# Patient Record
Sex: Female | Born: 1975
Health system: Southern US, Community
[De-identification: ages and names within clinical notes are randomized; demographics above are authoritative.]

## PROBLEM LIST (undated history)

## (undated) DIAGNOSIS — J302 Other seasonal allergic rhinitis: Secondary | ICD-10-CM

## (undated) DIAGNOSIS — Z9889 Other specified postprocedural states: Secondary | ICD-10-CM

## (undated) HISTORY — DX: Other specified postprocedural states: Z98.890

## (undated) HISTORY — PX: FOOT SURGERY: SHX648

## (undated) HISTORY — PX: NECK SURGERY: SHX720

---

## 2000-04-28 ENCOUNTER — Other Ambulatory Visit: Admission: RE | Admit: 2000-04-28 | Discharge: 2000-04-28 | Payer: Self-pay | Admitting: Obstetrics and Gynecology

## 2002-05-04 ENCOUNTER — Emergency Department (HOSPITAL_COMMUNITY): Admission: EM | Admit: 2002-05-04 | Discharge: 2002-05-04 | Payer: Self-pay | Admitting: Internal Medicine

## 2002-05-06 ENCOUNTER — Emergency Department (HOSPITAL_COMMUNITY): Admission: EM | Admit: 2002-05-06 | Discharge: 2002-05-06 | Payer: Self-pay | Admitting: Emergency Medicine

## 2002-07-03 ENCOUNTER — Emergency Department (HOSPITAL_COMMUNITY): Admission: EM | Admit: 2002-07-03 | Discharge: 2002-07-04 | Payer: Self-pay | Admitting: Emergency Medicine

## 2002-07-03 ENCOUNTER — Emergency Department (HOSPITAL_COMMUNITY): Admission: EM | Admit: 2002-07-03 | Discharge: 2002-07-03 | Payer: Self-pay | Admitting: Emergency Medicine

## 2002-07-05 ENCOUNTER — Ambulatory Visit (HOSPITAL_COMMUNITY): Admission: RE | Admit: 2002-07-05 | Discharge: 2002-07-05 | Payer: Self-pay | Admitting: Family Medicine

## 2002-07-05 ENCOUNTER — Encounter: Payer: Self-pay | Admitting: Family Medicine

## 2004-01-03 ENCOUNTER — Ambulatory Visit (HOSPITAL_COMMUNITY): Admission: RE | Admit: 2004-01-03 | Discharge: 2004-01-04 | Payer: Self-pay | Admitting: Neurosurgery

## 2007-06-23 ENCOUNTER — Other Ambulatory Visit: Admission: RE | Admit: 2007-06-23 | Discharge: 2007-06-23 | Payer: Self-pay | Admitting: Obstetrics and Gynecology

## 2008-01-02 ENCOUNTER — Inpatient Hospital Stay (HOSPITAL_COMMUNITY): Admission: EM | Admit: 2008-01-02 | Discharge: 2008-01-04 | Payer: Self-pay | Admitting: Emergency Medicine

## 2008-01-02 ENCOUNTER — Ambulatory Visit: Payer: Self-pay | Admitting: Surgery

## 2008-01-09 ENCOUNTER — Encounter: Admission: RE | Admit: 2008-01-09 | Discharge: 2008-01-09 | Payer: Self-pay | Admitting: Surgery

## 2008-01-09 ENCOUNTER — Ambulatory Visit: Payer: Self-pay | Admitting: Surgery

## 2010-02-22 ENCOUNTER — Encounter: Payer: Self-pay | Admitting: Surgery

## 2010-06-16 NOTE — Discharge Summary (Signed)
NAMEGRISELL, [name]            ACCOUNT NO.:  [id number]   MEDICAL RECORD NO.:  [id number]          PATIENT TYPE:  INP   LOCATION:  2004                         FACILITY:  [facility]   PHYSICIAN:  Evelene Croon, M.D.     DATE OF BIRTH:  17-Apr-1975   DATE OF ADMISSION:  01/02/2008  DATE OF DISCHARGE:  01/04/2008                               DISCHARGE SUMMARY   HISTORY:  The patient is a 35 year old white female who underwent a  steroid injection in her left rhomboid muscle on the date of admission  at the Orthopedic office.  She said that she felt some pain during the  injection and then developed progressive chest pressure with shortness  of breath after the procedure.  A chest x-ray was obtained, and this  revealed a 40% left pneumothorax.  Dr. Althea Charon contacted Dr. Laneta Simmers who  instructed him to send the patient to the emergency department.   PAST MEDICAL HISTORY:  No significant medical illnesses.   PAST SURGICAL HISTORY:  Cervical spine fusion in 2005.   MEDICATIONS PRIOR TO ADMISSION:  None.   ALLERGIES:  None.   SOCIAL HISTORY:  She is a nonsmoker.  Denies alcohol use.  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   FAMILY HISTORY:  Unremarkable.   REVIEW OF SYMPTOMS AND PHYSICAL EXAMINATION:  Please see the history and  physical.   HOSPITAL COURSE:  The patient was seen in the emergency department by  Dr. Laneta Simmers.  The patient was felt to require prompt insertion of a left-  sided chest tube.  This was done by Dr. Laneta Simmers.  She tolerated the  procedure well and was admitted to the floor.  A followup chest x-ray  showed the tube in good position with the tip at the apex and the lung  re-expanded.  There was no visible air leak.   The patient was monitored closely.  She remained hemodynamically stable.  She had chest x-ray on January 03, 2008, which showed no pneumothorax.  The chest tube had no air leak.  It was placed on water-seal.  The  following day, another  chest x-ray was obtained.  Again, this revealed  only a tiny apical pneumothorax with no air leak.  The chest tube was  then discontinued, and a repeat chest x-ray revealed no change in the  very tiny left apical pneumothorax.  She was, otherwise, stable and  deemed to be acceptable for discharge on January 04, 2008.   MEDICATIONS ON DISCHARGE:  Include Vicodin 5/500 one every 6 hours as  needed for pain.  She is to continue her previous supplements p.r.n.,  which include glucosamine and multiple vitamins.   FOLLOWUP:  The patient will see Dr. Laneta Simmers in 1 week in the office with  sutures to be removed at that time.  A repeat chest x-ray will be then  performed at that time.   INSTRUCTIONS:  The patient will receive written instructions regarding  medications, activity, diet, wound care, and followup.   FINAL DIAGNOSIS:  Left-sided pneumothorax following injection of the  rhomboid muscle.   OTHER  DIAGNOSIS:  Previous cervical spine surgery.      Rowe Clack, P.A.-C.      Evelene Croon, M.D.  Electronically Signed    WEG/MEDQ  D:  01/04/2008  T:  01/04/2008  Job:  [id number]   cc:   Evelene Croon, M.D.  Dr. Althea Charon

## 2010-06-16 NOTE — Assessment & Plan Note (Signed)
OFFICE VISIT   [name], [name]  DOB:  1975/09/19                                        January 09, 2008  CHART #:  [id number]   The patient returned today for followup status post recent admission for  left chest tube placement for iatrogenic left pneumothorax.  This was  performed on 01/02/2008.  The chest tube was in for about 2 days and was  removed without difficulty.  Since discharge, she said she has been  feeling fairly well overall, but is still little sore.  She has had no  shortness of breath.   PHYSICAL EXAMINATION:  VITAL SIGNS:  Today, her blood pressure is  130/79, pulse 89 and regular, respiratory rate 18 and unlabored.  Oxygen  saturation on room air is 100%.  GENERAL:  She looks well.  LUNGS:  Clear.  The chest tube site is well healed and the suture was  removed.   Followup chest x-ray shows complete re-expansion of the left lung with  no pneumothorax or effusion.   IMPRESSION:  The patient is recovering well following last chest tube  placement for iatrogenic pneumothorax after a rhomboid muscle injection  at one of the local orthopedic offices.  She requires no further  treatment.  I told her she could return to work when she is comfortable  enough to do so.  There is no limitation to her activity other than  discomfort.  She does not need to return to see me unless she develops  recurrent chest pain or shortness of breath.   Evelene Croon, M.D.  Electronically Signed   BB/MEDQ  D:  01/09/2008  T:  01/09/2008  Job:  [id number]

## 2010-06-16 NOTE — Op Note (Signed)
[name], EDELMAN            ACCOUNT NO.:  [id number]   MEDICAL RECORD NO.:  [id number]          PATIENT TYPE:  INP   LOCATION:  2004                         FACILITY:  [facility]   PHYSICIAN:  Evelene Croon, M.D.     DATE OF BIRTH:  07/10/1975   DATE OF PROCEDURE:  01/02/2008  DATE OF DISCHARGE:                               OPERATIVE REPORT   PREOPERATIVE DIAGNOSIS:  Complete collapse of left lung secondary to  traumatic left pneumothorax.   POSTOPERATIVE DIAGNOSIS:  Complete collapse of left lung secondary to  traumatic left pneumothorax.   PROCEDURE:  Insertion of left chest tube.   SURGEON:  Evelene Croon, MD   ANESTHESIA:  Lidocaine 1% local with intravenous morphine sulfate 4 mg.   CLINICAL HISTORY:  This patient is a 35 year old white female who  underwent steroid injection in the left rhomboid muscle earlier today at  an orthopedic office.  She developed progressive shortness of breath and  chest pressure postprocedure and chest x-ray showed collapse of the left  lung.  She was sent to the emergency room where a followup chest x-ray  showed complete collapse of the left lung.  The best treatment was  insertion of a chest tube.  I discussed the procedure with the patient  including alternatives, benefits, and risks including bleeding, injury  to lung, persistent air leak, and inability to reexpand the lung.  She  understood and agreed to proceed.   OPERATIVE PROCEDURE:  The procedure was performed in the emergency room  on a stretcher.  She was given 4 mg of intravenous morphine sulfate for  pain control.  The left side of the chest was then prepped with Betadine  solution and draped in the usual sterile manner.  The skin and  subcutaneous tissue in the anterior axillary line just lateral to the  left breast was anesthetized with 1% lidocaine local anesthesia without  epinephrine.  I used about 12 mL of this.  Then, a small incision was  made and carried down through  the subcutaneous tissue bluntly.  The  pleural space was entered bluntly using hemostat.  There was a large  rush of air.  Then, a 20-French trocar chest tube was inserted without  difficulty.  The trocar was slightly withdrawn with tube advanced up to  the apex.  This trocar was removed with tube connected to suction.  This  tube was sutured to the skin with silk suture.  A dry sterile dressing  was applied around the tube, which was taped to the skin.  Followup  chest x-ray showed the tube in good position with the tip of the apex  and lung reexpanded.  There was no visible ongoing air leak.      Evelene Croon, M.D.  Electronically Signed     BB/MEDQ  D:  01/02/2008  T:  01/03/2008  Job:  [id number]

## 2010-06-16 NOTE — H&P (Signed)
[name], [name]            ACCOUNT NO.:  [id number]   MEDICAL RECORD NO.:  [id number]          PATIENT TYPE:  INP   LOCATION:  2004                         FACILITY:  [facility]   PHYSICIAN:  Evelene Croon, M.D.     DATE OF BIRTH:  1975/11/06   DATE OF ADMISSION:  01/02/2008  DATE OF DISCHARGE:                              HISTORY & PHYSICAL   REASON FOR ADMISSION:  Left pneumothorax.   CLINICAL HISTORY:  This patient is a 35 year old white female who  underwent a steroid injection into her left rhomboid muscle earlier  today at one of the orthopedic offices.  She said that she felt some  pain during the injection and then developed progressive chest pressure  and shortness of breath after the procedure.  A chest x-ray was obtained  which showed a 40% left pneumothorax by report.  I was called by Dr.  Althea Charon and I told him to send the patient to the emergency room.   PAST MEDICAL HISTORY:  She has no prior medical illnesses.  Her only  previous surgery is a cervical spine fusion in 2005.   MEDICATIONS:  None.   ALLERGIES:  None   SOCIAL HISTORY:  She is a nonsmoker.  She denies alcohol abuse.  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   FAMILY HISTORY:  Negative.   REVIEW OF SYSTEMS:  GENERAL:  She denies any fever or chills.  She has  had no recent weight changes.  She denies fatigue.  EYES:  Negative.  ENT: Negative.  ENDOCRINE:  She denies diabetes and hypothyroidism.  CARDIOVASCULAR:  She has no history of chest pain or heart disease.  She  has had no dyspnea prior to this pneumothorax.  She denies palpitations.  RESPIRATORY:  She denies cough and sputum production.  She has no  respiratory disease.  GI:  She has had no nausea, vomiting.  She denies  melena and bright red blood per rectum.  GU:  Negative.  HEMATOLOGIC:  Negative.  NEUROLOGIC:  She denies any history of TIA or stroke.  She  has had no focal weakness or numbness.  She denies dizziness and  syncope.  MUSCULOSKELETAL:  She has had cervical spine fusion and has  had a lot of pain in her left shoulder and upper back.  She has  undergone previous injection of her trapezius muscle on that side.   PHYSICAL EXAMINATION:  GENERAL:  She is a well-developed, muscular white  female who is obviously uncomfortable.  She is in no respiratory  distress.  VITAL SIGNS:  Her blood pressure is 110/50 and her pulse is 105 and  regular.  Respiratory rate is 18 and slightly labored.  HEENT:  Normocephalic and atraumatic.  Pupils are equal and reactive to  light and accommodation.  Extraocular muscles are intact.  The throat is  clear.  NECK:  Normal carotid pulses bilaterally.  There are no bruits.  There  is no adenopathy or thyromegaly.  There is no JVD.  CARDIAC:  Regular rate and rhythm with normal S1 and S2.  There is no  murmur, rub, or gallop.  LUNGS:  No breath sounds on the left, clear breath sounds on the right.  ABDOMEN:  Active bowel sounds.  Her abdomen is soft and nontender.  There are no palpable masses or organomegaly.  EXTREMITIES:  No peripheral edema.  Pedal pulses are palpable  bilaterally.  SKIN:  Warm and dry.  NEUROLOGIC:  Alert and oriented x3.  Motor and sensory exam is grossly  normal.   Chest x-ray here shows complete collapse of the left lung.   IMPRESSION:  Complete collapse of the left lung due to pneumothorax  after injection of the left rhomboid muscle.  We will plan to place a  left chest tube and admit the patient for chest tube management to  suction.      Evelene Croon, M.D.  Electronically Signed     BB/MEDQ  D:  01/02/2008  T:  01/03/2008  Job:  [id number]

## 2017-10-30 ENCOUNTER — Emergency Department (HOSPITAL_BASED_OUTPATIENT_CLINIC_OR_DEPARTMENT_OTHER): Payer: Commercial Managed Care - PPO

## 2017-10-30 ENCOUNTER — Encounter (HOSPITAL_BASED_OUTPATIENT_CLINIC_OR_DEPARTMENT_OTHER): Payer: Self-pay | Admitting: Emergency Medicine

## 2017-10-30 ENCOUNTER — Other Ambulatory Visit: Payer: Self-pay

## 2017-10-30 ENCOUNTER — Emergency Department (HOSPITAL_BASED_OUTPATIENT_CLINIC_OR_DEPARTMENT_OTHER)
Admission: EM | Admit: 2017-10-30 | Discharge: 2017-10-30 | Disposition: A | Discharge status: Home / Self Care | Payer: Commercial Managed Care - PPO | Attending: Emergency Medicine | Admitting: Emergency Medicine

## 2017-10-30 DIAGNOSIS — N760 Acute vaginitis: Secondary | ICD-10-CM | POA: Insufficient documentation

## 2017-10-30 DIAGNOSIS — D259 Leiomyoma of uterus, unspecified: Secondary | ICD-10-CM | POA: Insufficient documentation

## 2017-10-30 DIAGNOSIS — B9689 Other specified bacterial agents as the cause of diseases classified elsewhere: Secondary | ICD-10-CM

## 2017-10-30 DIAGNOSIS — R102 Pelvic and perineal pain: Secondary | ICD-10-CM

## 2017-10-30 DIAGNOSIS — R1031 Right lower quadrant pain: Secondary | ICD-10-CM | POA: Diagnosis not present

## 2017-10-30 DIAGNOSIS — N83201 Unspecified ovarian cyst, right side: Secondary | ICD-10-CM

## 2017-10-30 HISTORY — DX: Other seasonal allergic rhinitis: J30.2

## 2017-10-30 LAB — WET PREP, GENITAL
Sperm: NONE SEEN
Trich, Wet Prep: NONE SEEN
Yeast Wet Prep HPF POC: NONE SEEN

## 2017-10-30 LAB — CBC
HCT: 39.2 % (ref 36.0–46.0)
Hemoglobin: 12.8 g/dL (ref 12.0–15.0)
MCH: 28.3 pg (ref 26.0–34.0)
MCHC: 32.7 g/dL (ref 30.0–36.0)
MCV: 86.5 fL (ref 78.0–100.0)
Platelets: 264 10*3/uL (ref 150–400)
RBC: 4.53 MIL/uL (ref 3.87–5.11)
RDW: 14.2 % (ref 11.5–15.5)
WBC: 7 10*3/uL (ref 4.0–10.5)

## 2017-10-30 LAB — COMPREHENSIVE METABOLIC PANEL
ALT: 16 U/L (ref 0–44)
AST: 26 U/L (ref 15–41)
Albumin: 4.3 g/dL (ref 3.5–5.0)
Alkaline Phosphatase: 45 U/L (ref 38–126)
Anion gap: 10 (ref 5–15)
BUN: 15 mg/dL (ref 6–20)
CO2: 24 mmol/L (ref 22–32)
Calcium: 9 mg/dL (ref 8.9–10.3)
Chloride: 104 mmol/L (ref 98–111)
Creatinine, Ser: 0.73 mg/dL (ref 0.44–1.00)
GFR calc Af Amer: >60 mL/min (ref >60)
GFR calc non Af Amer: >60 mL/min (ref >60)
Glucose, Bld: 97 mg/dL (ref 70–99)
Potassium: 4 mmol/L (ref 3.5–5.1)
Sodium: 138 mmol/L (ref 135–145)
Total Bilirubin: 0.4 mg/dL (ref 0.3–1.2)
Total Protein: 7.4 g/dL (ref 6.5–8.1)

## 2017-10-30 LAB — URINALYSIS, ROUTINE W REFLEX MICROSCOPIC
Bilirubin Urine: NEGATIVE
Glucose, UA: NEGATIVE mg/dL
Hgb urine dipstick: NEGATIVE
Ketones, ur: NEGATIVE mg/dL
Leukocytes, UA: NEGATIVE
Nitrite: NEGATIVE
Protein, ur: NEGATIVE mg/dL
Specific Gravity, Urine: 1.015 (ref 1.005–1.030)
pH: 7 (ref 5.0–8.0)

## 2017-10-30 LAB — PREGNANCY, URINE: Preg Test, Ur: NEGATIVE

## 2017-10-30 LAB — LIPASE, BLOOD: Lipase: 26 U/L (ref 11–51)

## 2017-10-30 MED ORDER — OXYCODONE-ACETAMINOPHEN 5-325 MG PO TABS: 1.0000 | ORAL_TABLET | Freq: Three times a day (TID) | ORAL | 0 refills | Status: DC | PRN

## 2017-10-30 MED ORDER — KETOROLAC TROMETHAMINE 30 MG/ML IJ SOLN
30.0000 mg | Freq: Once | INTRAMUSCULAR | Status: AC
  Administered 2017-10-30: 30 mg via INTRAVENOUS
  Filled 2017-10-30: qty 1

## 2017-10-30 MED ORDER — SODIUM CHLORIDE 0.9 % IV BOLUS
1000.0000 mL | Freq: Once | INTRAVENOUS | Status: AC
  Administered 2017-10-30: 1000 mL via INTRAVENOUS

## 2017-10-30 MED ORDER — NAPROXEN 500 MG PO TABS: 500.0000 mg | ORAL_TABLET | Freq: Two times a day (BID) | ORAL | 0 refills | Status: DC

## 2017-10-30 MED ORDER — METRONIDAZOLE 500 MG PO TABS: 500.0000 mg | ORAL_TABLET | Freq: Two times a day (BID) | ORAL | 0 refills | Status: DC

## 2017-10-30 MED ORDER — OXYCODONE-ACETAMINOPHEN 5-325 MG PO TABS
1.0000 | ORAL_TABLET | Freq: Once | ORAL | Status: AC
  Administered 2017-10-30: 1 via ORAL
  Filled 2017-10-30: qty 1

## 2017-10-30 MED ORDER — IOPAMIDOL (ISOVUE-300) INJECTION 61%
100.0000 mL | Freq: Once | INTRAVENOUS | Status: AC | PRN
  Administered 2017-10-30: 100 mL via INTRAVENOUS

## 2017-10-30 NOTE — Discharge Instructions (Addendum)
Follow-up with your OB/GYN. Return to ED for worsening symptoms, severe abdominal pain or chest pain, abnormal vaginal bleeding, lightheadedness or chest pain.

## 2017-10-30 NOTE — ED Notes (Signed)
Patient transported to Ultrasound and returned at this time

## 2017-10-30 NOTE — ED Notes (Signed)
Pt ambulatory to d/c window with steady gait. She states her ride is coming back to pick her up

## 2017-10-30 NOTE — ED Triage Notes (Signed)
Patient states that she has had right lower abdominal pain since yesterday - Reports Nausea

## 2017-10-30 NOTE — ED Provider Notes (Signed)
[facility] Provider Note   CSN: [id number] Arrival date & time: 10/30/17  1115     History   Chief Complaint Chief Complaint  Patient presents with  . Abdominal Pain    HPI [name] is a 42 y.o. female who presents to ED for 24-hour history of right lower quadrant abdominal pain, nausea.  Denies any changes to bowel movements.  Patient is concerned that it may be due to her appendix.  She describes the pain as sharp and will sometimes radiate to her back.  Has not taken any medicine help with her symptoms.  Denies any vomiting, vaginal complaints, abnormal vaginal bleeding, fever, sick contacts.  She denies any prior abdominal surgeries.  Denies any alcohol, tobacco or other drug use or chronic NSAID use.  Denies possibility of pregnancy.  HPI  Past Medical History:  Diagnosis Date  . Seasonal allergies     There are no active problems to display for this patient.   Past Surgical History:  Procedure Laterality Date  . FOOT SURGERY Bilateral   . NECK SURGERY       OB History   None      Home Medications    Prior to Admission medications   Medication Sig Start Date End Date Taking? Authorizing Provider  metroNIDAZOLE (FLAGYL) 500 MG tablet Take 1 tablet (500 mg total) by mouth 2 (two) times daily. 10/30/17   Kendalynn Wideman, PA-C  naproxen (NAPROSYN) 500 MG tablet Take 1 tablet (500 mg total) by mouth 2 (two) times daily. 10/30/17   Timya Trimmer, PA-C  oxyCODONE-acetaminophen (PERCOCET/ROXICET) 5-325 MG tablet Take 1 tablet by mouth every 8 (eight) hours as needed for severe pain. 10/30/17   Delia Heady, PA-C    Family History History reviewed. No pertinent family history.  Social History Social History   Tobacco Use  . Smoking status: Never Smoker  . Smokeless tobacco: Never Used  Substance Use Topics  . Alcohol use: Never    Frequency: Never  . Drug use: Never     Allergies   Patient has no known allergies.   Review  of Systems Review of Systems  Constitutional: Negative for appetite change, chills and fever.  HENT: Negative for ear pain, rhinorrhea, sneezing and sore throat.   Eyes: Negative for photophobia and visual disturbance.  Respiratory: Negative for cough, chest tightness, shortness of breath and wheezing.   Cardiovascular: Negative for chest pain and palpitations.  Gastrointestinal: Positive for abdominal pain and nausea. Negative for blood in stool, constipation, diarrhea and vomiting.  Genitourinary: Negative for dysuria, hematuria and urgency.  Musculoskeletal: Negative for myalgias.  Skin: Negative for rash.  Neurological: Negative for dizziness, weakness and light-headedness.     Physical Exam Updated Vital Signs BP 115/71 (BP Location: Right Arm)   Pulse (!) 55   Temp 98.3 F (36.8 C) (Oral)   Resp 18   Ht 5\' 7"  (1.702 m)   Wt 69.4 kg   LMP 10/02/2017   SpO2 100%   BMI 23.96 kg/m   Physical Exam  Constitutional: She appears well-developed and well-nourished. No distress.  HENT:  Head: Normocephalic and atraumatic.  Nose: Nose normal.  Eyes: Conjunctivae and EOM are normal. Left eye exhibits no discharge. No scleral icterus.  Neck: Normal range of motion. Neck supple.  Cardiovascular: Normal rate, regular rhythm, normal heart sounds and intact distal pulses. Exam reveals no gallop and no friction rub.  No murmur heard. Pulmonary/Chest: Effort normal and breath sounds normal. No  respiratory distress.  Abdominal: Soft. Bowel sounds are normal. She exhibits no distension. There is tenderness in the right lower quadrant. There is no rebound and no guarding.  Genitourinary: Cervix exhibits no motion tenderness. Right adnexum displays no tenderness. Left adnexum displays no tenderness. Vaginal discharge found.  Genitourinary Comments: Pelvic exam: normal external genitalia without evidence of trauma. VULVA: normal appearing vulva with no masses, tenderness or lesion. VAGINA:  normal appearing vagina with normal color and discharge, no lesions. CERVIX: normal appearing cervix without lesions, cervical motion tenderness absent, cervical os closed with out purulent discharge; No vaginal discharge. Wet prep and DNA probe for chlamydia and GC obtained.   ADNEXA: normal adnexa in size, nontender and no masses UTERUS: uterus is normal size, shape, consistency and nontender.   Tech served as Oncologist.  Musculoskeletal: Normal range of motion. She exhibits no edema.  Neurological: She is alert. She exhibits normal muscle tone. Coordination normal.  Skin: Skin is warm and dry. No rash noted.  Psychiatric: She has a normal mood and affect.  Nursing note and vitals reviewed.    ED Treatments / Results  Labs (all labs ordered are listed, but only abnormal results are displayed) Labs Reviewed  WET PREP, GENITAL - Abnormal; Notable for the following components:      Result Value   Clue Cells Wet Prep HPF POC PRESENT (*)    WBC, Wet Prep HPF POC MANY (*)    All other components within normal limits  LIPASE, BLOOD  COMPREHENSIVE METABOLIC PANEL  CBC  URINALYSIS, ROUTINE W REFLEX MICROSCOPIC  PREGNANCY, URINE  GC/CHLAMYDIA PROBE AMP ([place]) NOT AT [facility]    EKG None  Radiology Ct Abdomen Pelvis W Contrast  Result Date: 10/30/2017 CLINICAL DATA:  Right lower quadrant pain with nausea and vomiting EXAM: CT ABDOMEN AND PELVIS WITH CONTRAST TECHNIQUE: Multidetector CT imaging of the abdomen and pelvis was performed using the standard protocol following bolus administration of intravenous contrast. CONTRAST:  100 mL ISOVUE-300 IOPAMIDOL (ISOVUE-300) INJECTION 61% COMPARISON:  None. FINDINGS: Lower chest: Lung bases are clear. Note that there is a degree of pectus excavatum. Hepatobiliary: No focal liver lesions are appreciable. Gallbladder wall is not appreciably thickened. There is no biliary duct dilatation. Pancreas: There is no evident pancreatic  mass or inflammatory focus. Spleen: No splenic lesions are evident. Adrenals/Urinary Tract: Adrenals bilaterally appear normal. Kidneys bilaterally show no evident mass or hydronephrosis on either side. There is no evident renal or ureteral calculus on either side. Urinary bladder is midline with wall thickness within normal limits. Stomach/Bowel: There is no appreciable bowel wall or mesenteric thickening. No bowel obstruction appreciable. No free air or portal venous air. Vascular/Lymphatic: No abdominal aortic aneurysm. No vascular lesions are evident. There is no adenopathy in the abdomen or pelvis. Reproductive: Uterus is retroverted. There is a mass in the mid to lower uterus measuring 5.0 x 5.0 cm, a presumed dominant leiomyoma. There is a thin walled low-attenuation mass posterior to the uterus in the right pelvis between the uterus and rectum measuring 4.7 x 4.3 cm. This mass displaces the rectosigmoid junction toward the left. There is surrounding moderate free fluid. No other pelvic mass evident. Other: Appendix appears unremarkable. There is no abscess in the abdomen or pelvis beyond potential tubo-ovarian abscess in the right posterior pelvis. No ascites seen outside of the cul-de-sac region. Musculoskeletal: There are pars defects at L5 bilaterally with minimal anterolisthesis of L5 on S1. There are no blastic or lytic bone  lesions. There is no intramuscular or abdominal wall lesions. IMPRESSION: 1. Thin walled mass with attenuation values slightly higher than is expected with a simple cyst between the uterus and rectosigmoid junction on the right with adjacent moderate free fluid in the cul-de-sac. This mass measures 4.7 x 4.3 cm. Differential considerations for this mass include hemorrhagic ovarian cyst, tubo-ovarian abscess, and endometrioma. 2. Retroverted uterus. 5 x 5 cm leiomyoma in the mid to lower uterine region. 3. Appendix appears normal. No evident bowel obstruction. No findings suggesting  abscess outside of the pelvic region. 4.  No evident renal or ureteral calculus.  No hydronephrosis. 5. Pars defects at L5 bilaterally with minimal spondylolisthesis at L5-S1. 6.  Pectus excavatum, incompletely visualized. Electronically Signed   By: Lowella Grip III M.D.   On: 10/30/2017 13:34   US Pelvic Complete With Transvaginal  Result Date: 10/30/2017 CLINICAL DATA:  Pelvic mass seen on CT imaging.  Pain. EXAM: TRANSABDOMINAL AND TRANSVAGINAL ULTRASOUND OF PELVIS TECHNIQUE: Both transabdominal and transvaginal ultrasound examinations of the pelvis were performed. Transabdominal technique was performed for global imaging of the pelvis including uterus, ovaries, adnexal regions, and pelvic cul-de-sac. It was necessary to proceed with endovaginal exam following the transabdominal exam to visualize the endometrium and ovaries. COMPARISON:  CT scan October 30, 2017 FINDINGS: Uterus Measurements: 8.7 x 5.3 x 7.8 cm. Contains a large fibroid in the right uterine body posteriorly measuring 5.6 x 4.7 x 5.4 cm. Endometrium Thickness: 17 mm.  No focal abnormality visualized. Right ovary Measurements: 6.8 x 4.6 x 4.7 cm. There is a complex mass with no internal blood flow in the right ovary measuring 4 x 4.1 x 3.9 cm, likely accounting for the recent CT finding. Left ovary Measurements: 3.7 x 1.7 x 2.9 cm. Normal appearance/no adnexal mass. Other findings A small amount of fluid in the cul-de-sac is likely physiologic. IMPRESSION: 1. There is a complex 4.1 x 4 x 3.9 cm mass in the right ovary without internal blood flow. This mass likely accounts for the CT findings. The mass is nonspecific but could represent a hemorrhagic cyst. A neoplasm is not excluded on this single study. Recommend a follow-up ultrasound in 6-12 weeks to ensure resolution. 2. Large fibroid in the uterus. 3. No other acute abnormalities. Electronically Signed   By: Dorise Bullion III M.D   On: 10/30/2017 16:23    Procedures Procedures  (including critical care time)  Medications Ordered in ED Medications  sodium chloride 0.9 % bolus 1,000 mL (0 mLs Intravenous Stopped 10/30/17 1353)  iopamidol (ISOVUE-300) 61 % injection 100 mL (100 mLs Intravenous Contrast Given 10/30/17 1237)  ketorolac (TORADOL) 30 MG/ML injection 30 mg (30 mg Intravenous Given 10/30/17 1436)  oxyCODONE-acetaminophen (PERCOCET/ROXICET) 5-325 MG per tablet 1 tablet (1 tablet Oral Given 10/30/17 1636)     Initial Impression / Assessment and Plan / ED Course  I have reviewed the triage vital signs and the nursing notes.  Pertinent labs & imaging results that were available during my care of the patient were reviewed by me and considered in my medical decision making (see chart for details).     42 year old female with no significant past medical history presents to ED for 24-hour history of right lower quadrant abdominal pain and nausea.  No changes to bowel movements.  No history of similar symptoms in the past.  No sick contacts with similar symptoms.  On exam there is tenderness palpation of the right lower no prior abdominal surgeries or possibility of  pregnancy.  Lab work significant for normal CBC, lipase, CMP, urinalysis and negative pregnancy test.  CT of abdomen pelvis was done to rule out appendicitis.  This was negative for appendicitis but did show 4 x 4 centimeter mass and fibroid in the uterus.  Pelvic exam revealed scant white vaginal discharge.  Blood prep shows WBC and clue cells.  Ultrasound of the pelvis shows redemonstrated 4 x 4 centimeter mass which is most likely a hemorrhagic cyst but could be neoplasm.  Also shows a large uterine fibroid.  Recommend repeat ultrasound in 6 to 12 days.  Patient states that she is scheduled to meet with a new OB/GYN in 3 weeks.  Encouraged her to keep this appointment for repeat testing in the appropriate amount of time.  In the meantime we will treat with Flagyl, short course of opiate pain medication and  anti-inflammatories.  Patient remains hemodynamically stable with pain well controlled here.  Will advise her to return to ED for any severe worsening symptoms. [place] PMP reviewed with no discrepancies.  Portions of this note were generated with [employer]. Dictation errors may occur despite best attempts at proofreading.   Final Clinical Impressions(s) / ED Diagnoses   Final diagnoses:  Pelvic pain  Bacterial vaginosis  Cyst of right ovary  Uterine leiomyoma, unspecified location    ED Discharge Orders         Ordered    oxyCODONE-acetaminophen (PERCOCET/ROXICET) 5-325 MG tablet  Every 8 hours PRN     10/30/17 1635    naproxen (NAPROSYN) 500 MG tablet  2 times daily     10/30/17 1635    metroNIDAZOLE (FLAGYL) 500 MG tablet  2 times daily     10/30/17 1635           Delia Heady, PA-C 10/30/17 [place], Kevin, MD 10/31/17 1245

## 2017-10-31 LAB — GC/CHLAMYDIA PROBE AMP (~~LOC~~) NOT AT ARMC
Chlamydia: NEGATIVE
NEISSERIA GONORRHEA: NEGATIVE

## 2017-11-17 ENCOUNTER — Encounter: Payer: Self-pay | Admitting: Obstetrics & Gynecology

## 2017-11-17 ENCOUNTER — Ambulatory Visit (INDEPENDENT_AMBULATORY_CARE_PROVIDER_SITE_OTHER): Payer: Commercial Managed Care - PPO | Admitting: Obstetrics & Gynecology

## 2017-11-17 VITALS — BP 134/84 | HR 75 | Ht 68.0 in | Wt 158.0 lb

## 2017-11-17 DIAGNOSIS — N83201 Unspecified ovarian cyst, right side: Secondary | ICD-10-CM | POA: Diagnosis not present

## 2017-11-17 DIAGNOSIS — Z1151 Encounter for screening for human papillomavirus (HPV): Secondary | ICD-10-CM | POA: Diagnosis not present

## 2017-11-17 DIAGNOSIS — Z124 Encounter for screening for malignant neoplasm of cervix: Secondary | ICD-10-CM | POA: Diagnosis not present

## 2017-11-17 DIAGNOSIS — Z01419 Encounter for gynecological examination (general) (routine) without abnormal findings: Secondary | ICD-10-CM | POA: Diagnosis not present

## 2017-11-17 DIAGNOSIS — N9089 Other specified noninflammatory disorders of vulva and perineum: Secondary | ICD-10-CM

## 2017-11-17 DIAGNOSIS — Z23 Encounter for immunization: Secondary | ICD-10-CM

## 2017-11-17 DIAGNOSIS — D219 Benign neoplasm of connective and other soft tissue, unspecified: Secondary | ICD-10-CM

## 2017-11-17 NOTE — Progress Notes (Signed)
Subjective:    [name] is a 42 y.o. married P0 female who presents for an annual exam. The patient has no complaints today.  The patient is sexually active. GYN screening history: last pap: was normal. The patient wears seatbelts: yes. The patient participates in regular exercise: yes. Has the patient ever been transfused or tattooed?: yes. The patient reports that there is not domestic violence in her life.   Menstrual History: OB History    Gravida  0   Para  0   Term  0   Preterm  0   AB  0   Living  0     SAB  0   TAB  0   Ectopic  0   Multiple  0   Live Births  0           Menarche age: 60 Patient's last menstrual period was 11/10/2017.    The following portions of the patient's history were reviewed and updated as appropriate: allergies, current medications, past family history, past medical history, past social history, past surgical history and problem list.  Review of Systems Pertinent items are noted in HPI.   FH- no breast/gyn/colon cancer Works as Physiological scientist Married for 4 years She has a known fibroid.   Objective:    BP 134/84   Pulse 75   Ht 5\' 8"  (1.727 m)   Wt 158 lb (71.7 kg)   LMP 11/10/2017   BMI 24.02 kg/m   General Appearance:    Alert, cooperative, no distress, appears stated age  Head:    Normocephalic, without obvious abnormality, atraumatic  Eyes:    PERRL, conjunctiva/corneas clear, EOM's intact, fundi    benign, both eyes  Ears:    Normal TM's and external ear canals, both ears  Nose:   Nares normal, septum midline, mucosa normal, no drainage    or sinus tenderness  Throat:   Lips, mucosa, and tongue normal; teeth and gums normal  Neck:   Supple, symmetrical, trachea midline, no adenopathy;    thyroid:  no enlargement/tenderness/nodules; no carotid   bruit or JVD  Back:     Symmetric, no curvature, ROM normal, no CVA tenderness  Lungs:     Clear to auscultation bilaterally, respirations unlabored  Chest Wall:     No tenderness or deformity   Heart:    Regular rate and rhythm, S1 and S2 normal, no murmur, rub   or gallop  Breast Exam:    No tenderness, masses, or nipple abnormality  Abdomen:     Soft, non-tender, bowel sounds active all four quadrants,    no masses, no organomegaly  Genitalia:    Normal female without lesion, discharge or tenderness, in the crux between the right labias, there is a 6 mm open cut like area. There is diffuse white patches on her vulva c/w lichen, grossly enlarged non-mobile retroverted uterus, about 10 week size     Extremities:   Extremities normal, atraumatic, no cyanosis or edema  Pulses:   2+ and symmetric all extremities  Skin:   Skin color, texture, turgor normal, no rashes or lesions  Lymph nodes:   Cervical, supraclavicular, and axillary nodes normal  Neurologic:   CNII-XII intact, normal strength, sensation and reflexes    throughout  .    Assessment:    Healthy female exam.   itchy sore area of right vulva White skin changes of vulva  Plan:     Thin prep Pap smear. with cotesting Mammogram  next year TDAP and flu vaccines Check HSV2 IgG Schedule vulvar biopsy

## 2017-11-17 NOTE — Progress Notes (Signed)
Last pap was probably 10 years ago- normal Never had mammogram PT was told in Sept- ovarian cyst- [facility] records in chart

## 2017-11-18 LAB — HSV 2 ANTIBODY, IGG: HSV 2 Glycoprotein G Ab, IgG: <0.9 index

## 2017-11-18 LAB — CYTOLOGY - PAP
Diagnosis: NEGATIVE
HPV (WINDOPATH): NOT DETECTED

## 2017-11-18 LAB — CA 125: CA 125: 33 U/mL (ref <35)

## 2017-12-01 ENCOUNTER — Ambulatory Visit: Payer: Commercial Managed Care - PPO | Admitting: Obstetrics & Gynecology

## 2017-12-05 ENCOUNTER — Ambulatory Visit (INDEPENDENT_AMBULATORY_CARE_PROVIDER_SITE_OTHER): Payer: Commercial Managed Care - PPO | Admitting: Obstetrics & Gynecology

## 2017-12-05 ENCOUNTER — Encounter: Payer: Self-pay | Admitting: Obstetrics & Gynecology

## 2017-12-05 VITALS — BP 128/72 | HR 100 | Resp 16 | Ht 68.0 in | Wt 158.0 lb

## 2017-12-05 DIAGNOSIS — N904 Leukoplakia of vulva: Secondary | ICD-10-CM | POA: Diagnosis not present

## 2017-12-05 DIAGNOSIS — N898 Other specified noninflammatory disorders of vagina: Secondary | ICD-10-CM

## 2017-12-05 DIAGNOSIS — L900 Lichen sclerosus et atrophicus: Secondary | ICD-10-CM

## 2017-12-05 DIAGNOSIS — L821 Other seborrheic keratosis: Secondary | ICD-10-CM | POA: Diagnosis not present

## 2017-12-07 NOTE — Progress Notes (Signed)
Subjective:    Patient ID: [name], female    DOB: 08-16-1975, 42 y.o.   MRN: [id number]  HPI  41 yo female presents for further discussion of vulvar issues and for biopsy of right vulva between labia majora and minora.  Pt also for f/u of adnexal mass. Pt aware of need for biopsy of right labia; however, she has concerns of macular lesion on mons pubis that have increased between visits.  She thinks they are warts.  She is having itching.  She has not taken any medication to relieve symptoms.   Review of Systems  Constitutional: Negative.   Respiratory: Negative.   Cardiovascular: Negative.   Gastrointestinal: Negative.   Genitourinary:       Vaginal itching Macules on mons pubis      Objective:   Physical Exam  Constitutional: She is oriented to person, place, and time. She appears well-developed and well-nourished. No distress.  HENT:  Head: Normocephalic and atraumatic.  Eyes: Conjunctivae are normal.  Cardiovascular: Normal rate.  Pulmonary/Chest: Effort normal.  Abdominal: Soft. Bowel sounds are normal. She exhibits no distension and no mass. There is no tenderness. There is no rebound and no guarding.  Genitourinary:     Musculoskeletal: She exhibits no edema.  Neurological: She is alert and oriented to person, place, and time.  Skin: Skin is warm and dry.  Psychiatric: She has a normal mood and affect.  Vitals reviewed.  Vitals:   12/05/17 1011  BP: 128/72  Pulse: 100  Resp: 16  Weight: 158 lb (71.7 kg)  Height: 5\' 8"  (1.727 m)   FINDINGS: Uterus  Measurements: 8.7 x 5.3 x 7.8 cm. Contains a large fibroid in the right uterine body posteriorly measuring 5.6 x 4.7 x 5.4 cm.  Endometrium  Thickness: 17 mm.  No focal abnormality visualized.  Right ovary  Measurements: 6.8 x 4.6 x 4.7 cm. There is a complex mass with no internal blood flow in the right ovary measuring 4 x 4.1 x 3.9 cm, likely accounting for the recent CT finding.  Left  ovary  Measurements: 3.7 x 1.7 x 2.9 cm. Normal appearance/no adnexal mass.  Other findings  A small amount of fluid in the cul-de-sac is likely physiologic.  IMPRESSION: 1. There is a complex 4.1 x 4 x 3.9 cm mass in the right ovary without internal blood flow. This mass likely accounts for the CT findings. The mass is nonspecific but could represent a hemorrhagic cyst. A neoplasm is not excluded on this single study. Recommend a follow-up ultrasound in 6-12 weeks to ensure resolution. 2. Large fibroid in the uterus. 3. No other acute abnormalities.     Assessment & Plan:  41 yo female with right adnexal mass, bilateral labial white epithelial changes and fusion of labia minora, and mons pubis pacques.  1.  Right ovarian mass-  Has follow up US in November 2.  White vulvar lesion--biopasy 3.  Mons pubis macules--biopsy  VULVAR BIOPSY NOTE  The indications for vulvar biopsy (rule out neoplasia, establish lichen sclerosus diagnosis) were reviewed.   Risks of the biopsy including pain, bleeding, infection, inadequate specimen, scarring and need for additional procedures  were discussed. The patient stated understanding and agreed to undergo procedure today. Consent was signed,  time out performed.  The patient's vulva was prepped with Betadine. 1% lidocaine was injected into 2 areas as noted in diagram.  Two biopsies were taken (right area between labia minora and majora) and on mons pubis A 3-mm  punch biopsy was used on both and the biopsy tissue was picked up with sterile forceps and sterile scissors were used to excise the lesion.  Small bleeding was noted and hemostasis was achieved using silver nitrate sticks.  The specimens were place in separate bottles.  The patient tolerated the procedure well. Post-procedure instructions  (pelvic rest for one week) were given to the patient. The patient is to call with heavy bleeding, fever greater than 100.4, foul smelling vaginal discharge  or other concerns. The patient will be return to clinic in two weeks for discussion of results.  15 minutes spent face to face with patient with >50% counseling about ovarian cyst and possible diagnoses.

## 2017-12-09 DIAGNOSIS — L821 Other seborrheic keratosis: Secondary | ICD-10-CM | POA: Insufficient documentation

## 2017-12-09 DIAGNOSIS — L900 Lichen sclerosus et atrophicus: Secondary | ICD-10-CM | POA: Insufficient documentation

## 2017-12-14 ENCOUNTER — Telehealth: Payer: Self-pay | Admitting: *Deleted

## 2017-12-14 MED ORDER — CLOBETASOL PROPIONATE 0.05 % EX OINT: TOPICAL_OINTMENT | CUTANEOUS | 0 refills | Status: DC

## 2017-12-14 NOTE — Telephone Encounter (Signed)
Pt notified of pathology results.  Clobetasol ointment was sent to her pharmacy and she will have cryotherapy on the 18 per Dr Gala Romney.

## 2017-12-14 NOTE — Telephone Encounter (Signed)
-----   Message from Guss Bunde, MD sent at 12/09/2017  9:62 PM EST ----- Lichen sclerosis of labial biospy--treat with clobetasol.   Seborrheic keratosis--cyrotherapy can be done.  Pt to dome to office for f/u and possible treatment.  RN to call.

## 2017-12-19 ENCOUNTER — Encounter: Payer: Self-pay | Admitting: Obstetrics & Gynecology

## 2017-12-19 ENCOUNTER — Ambulatory Visit (INDEPENDENT_AMBULATORY_CARE_PROVIDER_SITE_OTHER): Payer: Commercial Managed Care - PPO | Admitting: Obstetrics & Gynecology

## 2017-12-19 VITALS — Ht 68.0 in | Wt 158.0 lb

## 2017-12-19 DIAGNOSIS — L821 Other seborrheic keratosis: Secondary | ICD-10-CM | POA: Diagnosis not present

## 2017-12-19 DIAGNOSIS — N83209 Unspecified ovarian cyst, unspecified side: Secondary | ICD-10-CM | POA: Diagnosis not present

## 2017-12-19 DIAGNOSIS — L900 Lichen sclerosus et atrophicus: Secondary | ICD-10-CM | POA: Diagnosis not present

## 2017-12-19 NOTE — Progress Notes (Signed)
   Subjective:    Patient ID: [name], female    DOB: [birth year], 42 y.o.   MRN: [id number]  HPI  Pt presents for f/u of seb ker and Lichen sclerosis.   Pt has started the clobetasol once a day and she feels a little better.  She would like to go ahead and have lesions treated on her mons pubis.  She also wants me to evaluate a reddish lesion near the fused skin at the top of the labia majora midline.  Review of Systems  Constitutional: Negative.   Respiratory: Negative.   Cardiovascular: Negative.   Gastrointestinal: Negative.   Genitourinary: Negative.        Objective:   Physical Exam  Constitutional: She is oriented to person, place, and time. She appears well-developed and well-nourished. No distress.  HENT:  Head: Normocephalic and atraumatic.  Eyes: Conjunctivae are normal.  Cardiovascular: Normal rate.  Pulmonary/Chest: Effort normal.  Abdominal: Soft. Bowel sounds are normal. She exhibits no distension and no mass. There is no tenderness. There is no rebound and no guarding.  Genitourinary:     Musculoskeletal: She exhibits no edema.  Neurological: She is alert and oriented to person, place, and time.  Skin: Skin is warm and dry.  Psychiatric: She has a normal mood and affect.  Vitals reviewed.  Vitals:   12/19/17 1017  Weight: 158 lb (71.7 kg)  Height: 5\' 8"  (1.727 m)   Cryotherapy of approximately 10 macules on mons pubis.  Good blanching occurred.  2 cycles of freeze thaw for each lesion.    Assessment & Plan:  42 year old female with 3 vulvar issues 1.  Need clobetasol daily 2 whitish areas affected by lichen sclerosus as well as the midline as described above.  Recheck in 2 weeks 2.  Cryotherapy destruction of seborrheic keratosis on mons pubis 3.  Recheck area at midline that is red in 2 weeks. 4.  Patient has follow-up ultrasound for ovarian cyst scheduled.

## 2017-12-23 ENCOUNTER — Ambulatory Visit (INDEPENDENT_AMBULATORY_CARE_PROVIDER_SITE_OTHER): Payer: Commercial Managed Care - PPO

## 2017-12-23 DIAGNOSIS — D251 Intramural leiomyoma of uterus: Secondary | ICD-10-CM | POA: Diagnosis not present

## 2017-12-23 DIAGNOSIS — N83201 Unspecified ovarian cyst, right side: Secondary | ICD-10-CM

## 2017-12-27 DIAGNOSIS — Z0000 Encounter for general adult medical examination without abnormal findings: Secondary | ICD-10-CM | POA: Diagnosis not present

## 2017-12-27 DIAGNOSIS — L900 Lichen sclerosus et atrophicus: Secondary | ICD-10-CM | POA: Diagnosis not present

## 2018-01-09 ENCOUNTER — Encounter: Payer: Self-pay | Admitting: Obstetrics & Gynecology

## 2018-01-09 ENCOUNTER — Ambulatory Visit (INDEPENDENT_AMBULATORY_CARE_PROVIDER_SITE_OTHER): Payer: Commercial Managed Care - PPO | Admitting: Obstetrics & Gynecology

## 2018-01-09 VITALS — BP 112/80 | HR 68 | Resp 16 | Ht 68.0 in | Wt 158.0 lb

## 2018-01-09 DIAGNOSIS — L900 Lichen sclerosus et atrophicus: Secondary | ICD-10-CM

## 2018-01-09 DIAGNOSIS — L821 Other seborrheic keratosis: Secondary | ICD-10-CM

## 2018-01-09 NOTE — Progress Notes (Signed)
   Subjective:    Patient ID: [name], female    DOB: 1975/03/22, 42 y.o.   MRN: [id number]  HPI  42 year old female presents for follow-up of lichen sclerosis and seborrheic keratoses.  Patient had a 75% response to the seborrheic keratosis treatment.  The red spot at the top near her urethra has diminished in size considerably.  She has been using the steroid twice a week maintenance now.  She would like a repeat application of cryotherapy.  Not having itching discharge or other symptoms.  Review of Systems  Constitutional: Negative.   Respiratory: Negative.   Cardiovascular: Negative.   Gastrointestinal: Negative.   Genitourinary: Negative.        Objective:   Physical Exam  Constitutional: She is oriented to person, place, and time. She appears well-developed and well-nourished. No distress.  HENT:  Head: Normocephalic and atraumatic.  Eyes: Conjunctivae are normal.  Cardiovascular: Normal rate.  Pulmonary/Chest: Effort normal.  Abdominal:    Genitourinary:     Musculoskeletal: She exhibits no edema.  Neurological: She is alert and oriented to person, place, and time.  Skin: Skin is warm and dry.  Psychiatric: She has a normal mood and affect.  Vitals reviewed.  Vitals:   01/09/18 1047  BP: 112/80  Pulse: 68  Resp: 16  Weight: 158 lb (71.7 kg)  Height: 5\' 8"  (1.727 m)       Assessment & Plan:  42 yo female with lichen gnosis and seborrheic keratosis  1.  Crease clobetasol to 3 times a week for 1 month 2.  Cryotherapy for lesions on mons 3.  RTC 6-8 weeks

## 2018-02-23 ENCOUNTER — Ambulatory Visit (INDEPENDENT_AMBULATORY_CARE_PROVIDER_SITE_OTHER): Payer: Commercial Managed Care - PPO | Admitting: Obstetrics & Gynecology

## 2018-02-23 ENCOUNTER — Encounter: Payer: Self-pay | Admitting: Obstetrics & Gynecology

## 2018-02-23 VITALS — BP 129/61 | HR 74 | Ht 68.0 in | Wt 156.0 lb

## 2018-02-23 DIAGNOSIS — L900 Lichen sclerosus et atrophicus: Secondary | ICD-10-CM | POA: Diagnosis not present

## 2018-02-23 NOTE — Progress Notes (Signed)
   Subjective:    Patient ID: [name], female    DOB: 04-17-1975, 43 y.o.   MRN: [id number]  HPI  Pt presents to f/u of seborrheic keratoses and lichen sclerosis.  Pt sattsifed with SK cyro therapy.  There are partially visually present but are barely palpable.    Review of Systems  Constitutional: Negative.   Respiratory: Negative.   Cardiovascular: Negative.   Gastrointestinal: Negative.   Genitourinary: Negative.        Objective:   Physical Exam Vitals signs reviewed.  Constitutional:      General: She is not in acute distress.    Appearance: She is well-developed.  HENT:     Head: Normocephalic and atraumatic.  Eyes:     Conjunctiva/sclera: Conjunctivae normal.  Cardiovascular:     Rate and Rhythm: Normal rate.  Pulmonary:     Effort: Pulmonary effort is normal.  Genitourinary:    Comments: No plaques or discolorations. Right labia minora architecture decreased--encouarged to apply more steroid in this area.   Skin:    General: Skin is warm and dry.  Neurological:     Mental Status: She is alert and oriented to person, place, and time.       Assessment & Plan:  43 yo female with SKs and Lichen Sclerosis  1.  Continue clobetasol twice a week 2.  Treatment for SK is complete and patient satisfied with result.   3.  RTC 6 months.

## 2018-05-15 ENCOUNTER — Other Ambulatory Visit: Payer: Self-pay | Admitting: *Deleted

## 2018-05-15 MED ORDER — CLOBETASOL PROPIONATE 0.05 % EX OINT: TOPICAL_OINTMENT | CUTANEOUS | 1 refills | Status: DC

## 2018-09-08 ENCOUNTER — Encounter

## 2018-11-27 ENCOUNTER — Other Ambulatory Visit: Payer: Self-pay

## 2018-11-27 ENCOUNTER — Ambulatory Visit (INDEPENDENT_AMBULATORY_CARE_PROVIDER_SITE_OTHER): Payer: Commercial Managed Care - PPO | Admitting: Obstetrics & Gynecology

## 2018-11-27 ENCOUNTER — Encounter: Payer: Self-pay | Admitting: Obstetrics & Gynecology

## 2018-11-27 VITALS — BP 124/84 | HR 80 | Ht 68.0 in | Wt 156.0 lb

## 2018-11-27 DIAGNOSIS — Z01419 Encounter for gynecological examination (general) (routine) without abnormal findings: Secondary | ICD-10-CM | POA: Diagnosis not present

## 2018-11-27 DIAGNOSIS — Z23 Encounter for immunization: Secondary | ICD-10-CM | POA: Diagnosis not present

## 2018-11-27 NOTE — Progress Notes (Signed)
Subjective:     [name] is a 43 y.o. female here for a routine exam.  Current complaints: feels than lichen sclerosis is well controlled and satisfied with condyloma distruction on mons.  Pt has monthly menses and takes tumeric to help with bleeding.      Gynecologic History Patient's last menstrual period was 11/06/2018. Contraception: none / same sex relationship Last Pap: 11/2017. Results were: normal Last mammogram: never--would like to start screening at 43 yo after review of the recommendations.   Obstetric History OB History  Gravida Para Term Preterm AB Living  0 0 0 0 0 0  SAB TAB Ectopic Multiple Live Births  0 0 0 0 0     The following portions of the patient's history were reviewed and updated as appropriate: allergies, current medications, past family history, past medical history, past social history, past surgical history and problem list.  Review of Systems Pertinent items noted in HPI and remainder of comprehensive ROS otherwise negative.    Objective:   Vitals:   11/27/18 0845  BP: 124/84  Pulse: 80  Weight: 156 lb (70.8 kg)  Height: 5\' 8"  (1.727 m)   Vitals:  WNL General appearance: alert, cooperative and no distress  HEENT: Normocephalic, without obvious abnormality, atraumatic Eyes: negative Throat: lips, mucosa, and tongue normal; teeth and gums normal  Respiratory: Clear to auscultation bilaterally  CV: Regular rate and rhythm  Breasts:  Normal appearance, no masses or tenderness, no nipple retraction or dimpling  GI: Soft, non-tender; bowel sounds normal; no masses,  no organomegaly  GU: External Genitalia:  Tanner V, LS under control; labia minora on right is smaller but not changed from last year. Urethra:  No prolapse   Vagina: Pink, normal rugae, no blood or discharge  Cervix: No CMT, no lesion  Uterus:  Normal size and contour, non tender  Adnexa: Normal, no masses, non tender  Musculoskeletal: No edema, redness or tenderness in the  calves or thighs  Skin: No lesions or rash  Lymphatic: Axillary adenopathy: none     Psychiatric: Normal mood and behavior    Assessment:    Healthy female exam.   stable LS Plan:    Pap not due Mammogram age 50 Flu shot Continue Clobetasol

## 2018-11-27 NOTE — Progress Notes (Signed)
Last pap 11/17/17- negative 

## 2018-11-29 ENCOUNTER — Other Ambulatory Visit: Payer: Self-pay

## 2018-11-29 ENCOUNTER — Emergency Department (HOSPITAL_BASED_OUTPATIENT_CLINIC_OR_DEPARTMENT_OTHER): Payer: Commercial Managed Care - PPO

## 2018-11-29 ENCOUNTER — Encounter (HOSPITAL_BASED_OUTPATIENT_CLINIC_OR_DEPARTMENT_OTHER): Payer: Self-pay

## 2018-11-29 ENCOUNTER — Emergency Department (HOSPITAL_BASED_OUTPATIENT_CLINIC_OR_DEPARTMENT_OTHER)
Admission: EM | Admit: 2018-11-29 | Discharge: 2018-11-29 | Disposition: A | Discharge status: Home / Self Care | Payer: Commercial Managed Care - PPO | Attending: Emergency Medicine | Admitting: Emergency Medicine

## 2018-11-29 DIAGNOSIS — H538 Other visual disturbances: Secondary | ICD-10-CM | POA: Insufficient documentation

## 2018-11-29 LAB — CBG MONITORING, ED: Glucose-Capillary: 94 mg/dL (ref 70–99)

## 2018-11-29 MED ORDER — ERYTHROMYCIN 5 MG/GM OP OINT: TOPICAL_OINTMENT | OPHTHALMIC | 0 refills | Status: DC

## 2018-11-29 NOTE — ED Notes (Signed)
Dr. Alanda Slim opthomology to call back on dr. Estanislado Pandy portable phone.

## 2018-11-29 NOTE — ED Triage Notes (Addendum)
Pt states she had episode of blurred vision at ~245p after waking forma nap-lasted ~20 min-states she "felt feverish and started to panic"-feels vision is "95% better"-pt was seen and sent from [facility]-pt is anxious-NAD-steady gait

## 2018-11-29 NOTE — Discharge Instructions (Signed)
Use erythromycin ointment 3 times daily as prescribed.  Follow-up with Dr. Alanda Slim in the next 1 to 2 days.  His contact information has been provided in this discharge summary for you to call and make these arrangements.  Return to the ER if symptoms significantly worsen or change in the meantime.

## 2018-11-29 NOTE — ED Provider Notes (Signed)
[facility] EM[facility]ENC[facility] DEPARTMENT Provider Note   CSN: [id number] Arrival date & time: 11/29/18  1648     History   Chief Complaint Chief Complaint  Patient presents with  . Blurred Vision    HPI [name] is a 43 y.o. female.     Patient is a 43 year old female with no significant past medical history.  She presents today for evaluation of visual disturbances.  Patient states that she laid down for a nap today on the couch after assisting her kids with remote learning.  She woke up approximately 30 minutes later with blurry vision in both eyes.  She stated she felt as though she could not focus and her vision was very blurry.  She was able to call a neighbor for assistance who took her to urgent care.  She was seen there, then referred here for further testing.  Patient states this lasted for approximately 40 to 45 minutes, however has since resolved.  She now states that her vision is back to normal and has no complaints.  She denies any numbness or tingling or weakness of the extremities during this episode.  The history is provided by the patient.    Past Medical History:  Diagnosis Date  . Seasonal allergies     Patient Active Problem List   Diagnosis Date Noted  . Lichen sclerosus 99991111  . Seborrheic keratoses 12/09/2017  . Fibroid 11/17/2017    Past Surgical History:  Procedure Laterality Date  . FOOT SURGERY Bilateral   . NECK SURGERY       OB History    Gravida  0   Para  0   Term  0   Preterm  0   AB  0   Living  0     SAB  0   TAB  0   Ectopic  0   Multiple  0   Live Births  0            Home Medications    Prior to Admission medications   Medication Sig Start Date End Date Taking? Authorizing Provider  clobetasol ointment (TEMOVATE) 0.05 % Use a small amount to affected area daily 05/15/18   Emily Filbert, MD    Family History No family history on file.  Social History Social History   Tobacco Use  .  Smoking status: Never Smoker  . Smokeless tobacco: Never Used  Substance Use Topics  . Alcohol use: Yes    Frequency: Never    Comment: occ  . Drug use: Never     Allergies   Patient has no known allergies.   Review of Systems Review of Systems  All other systems reviewed and are negative.    Physical Exam Updated Vital Signs BP 140/88 (BP Location: Left Arm)   Pulse 75   Temp 99.2 F (37.3 C) (Oral)   Resp 16   Ht 5\' 8"  (1.727 m)   Wt 70.3 kg   LMP 11/06/2018   SpO2 100%   BMI 23.57 kg/m   Physical Exam Vitals signs and nursing note reviewed.  Constitutional:      General: She is not in acute distress.    Appearance: She is well-developed. She is not diaphoretic.  HENT:     Head: Normocephalic and atraumatic.  Neck:     Musculoskeletal: Normal range of motion and neck supple.  Cardiovascular:     Rate and Rhythm: Normal rate and regular rhythm.     Heart  sounds: No murmur. No friction rub. No gallop.   Pulmonary:     Effort: Pulmonary effort is normal. No respiratory distress.     Breath sounds: Normal breath sounds. No wheezing.  Abdominal:     General: Bowel sounds are normal. There is no distension.     Palpations: Abdomen is soft.     Tenderness: There is no abdominal tenderness.  Musculoskeletal: Normal range of motion.  Skin:    General: Skin is warm and dry.  Neurological:     General: No focal deficit present.     Mental Status: She is alert and oriented to person, place, and time.     Cranial Nerves: No cranial nerve deficit.     Sensory: No sensory deficit.     Motor: No weakness.     Coordination: Coordination normal.      ED Treatments / Results  Labs (all labs ordered are listed, but only abnormal results are displayed) Labs Reviewed  CBG MONITORING, ED    EKG None  Radiology No results found.  Procedures Procedures (including critical care time)  Medications Ordered in ED Medications - No data to display   Initial  Impression / Assessment and Plan / ED Course  I have reviewed the triage vital signs and the nursing notes.  Pertinent labs & imaging results that were available during my care of the patient were reviewed by me and considered in my medical decision making (see chart for details).  Patient presents here with complaints of blurry vision that started after she had taken a nap.  When she woke up she was having difficulty focusing and had to call a neighbor to bring her to urgent care.  She was then sent here for evaluation.  Her vision is now back to normal and her physical exam and eye exam are unremarkable.  Patient did have a head CT which was negative.  In discussion with Dr. Alanda Slim from Ophthalmology who feels as though she had likely slept with her eyes partially open.  He is advising erythromycin ointment and follow-up with him in the next 1 to 2 days.  She is to call tomorrow to make these arrangements.  Final Clinical Impressions(s) / ED Diagnoses   Final diagnoses:  None    ED Discharge Orders    None       Veryl Speak, MD 11/29/18 [phone]

## 2018-11-29 NOTE — ED Notes (Signed)
Patient verbalizes understanding of discharge instructions. Opportunity for questioning and answers were provided. Armband removed by staff, pt discharged from ED ambulatory.   

## 2019-06-02 ENCOUNTER — Other Ambulatory Visit: Payer: Self-pay | Admitting: Obstetrics & Gynecology

## 2019-12-03 ENCOUNTER — Other Ambulatory Visit: Payer: Self-pay

## 2019-12-03 ENCOUNTER — Encounter: Payer: Self-pay | Admitting: Obstetrics & Gynecology

## 2019-12-03 ENCOUNTER — Ambulatory Visit (INDEPENDENT_AMBULATORY_CARE_PROVIDER_SITE_OTHER): Payer: Commercial Managed Care - PPO | Admitting: Obstetrics & Gynecology

## 2019-12-03 VITALS — BP 113/67 | HR 64 | Resp 16 | Ht 68.0 in | Wt 155.0 lb

## 2019-12-03 DIAGNOSIS — Z23 Encounter for immunization: Secondary | ICD-10-CM | POA: Diagnosis not present

## 2019-12-03 DIAGNOSIS — Z01419 Encounter for gynecological examination (general) (routine) without abnormal findings: Secondary | ICD-10-CM

## 2019-12-03 DIAGNOSIS — L900 Lichen sclerosus et atrophicus: Secondary | ICD-10-CM | POA: Diagnosis not present

## 2019-12-03 MED ORDER — CLOBETASOL PROPIONATE 0.05 % EX OINT
TOPICAL_OINTMENT | CUTANEOUS | 1 refills | Status: DC
Start: 2019-12-03 — End: 2020-12-08

## 2019-12-03 NOTE — Progress Notes (Signed)
Subjective:     [name] is a 44 y.o. female here for a routine exam.  Current complaints: uses clobetasol for LS; can tell If she skips because itching increases.   Gynecologic History No LMP recorded. Contraception: same sex relationoship (married) Last Pap: 2019. Results were: normal Last mammogram: never. Would like to start at 44 yo  Obstetric History OB History  Gravida Para Term Preterm AB Living  0 0 0 0 0 0  SAB TAB Ectopic Multiple Live Births  0 0 0 0 0     The following portions of the patient's history were reviewed and updated as appropriate: allergies, current medications, past family history, past medical history, past social history, past surgical history and problem list.  Review of Systems Pertinent items noted in HPI and remainder of comprehensive ROS otherwise negative.    Objective:      Vitals:   12/03/19 0737  BP: 113/67  Pulse: 64  Resp: 16  Weight: 155 lb (70.3 kg)  Height: 5\' 8"  (1.727 m)   Vitals:  WNL General appearance: alert, cooperative and no distress  HEENT: Normocephalic, without obvious abnormality, atraumatic Eyes: negative Throat: lips, mucosa, and tongue normal; teeth and gums normal  Respiratory: Clear to auscultation bilaterally  CV: Regular rate and rhythm  Breasts:  Normal appearance, no masses or tenderness, no nipple retraction or dimpling  GI: Soft, non-tender; bowel sounds normal; no masses,  no organomegaly  GU: External Genitalia:  Tanner V, stable LS on perineum; stable pigmented areas on labia minora Urethra:  No prolapse   Vagina: Pink, normal rugae, no blood or discharge  Cervix: No CMT, no lesion  Uterus:  Normal size and contour, non tender  Adnexa: Normal, no masses, non tender  Musculoskeletal: No edema, redness or tenderness in the calves or thighs  Skin: No lesions or rash  Lymphatic: Axillary adenopathy: none     Psychiatric: Normal mood and behavior        Assessment:    Healthy female exam.    Lichen sclerosis Plan:    1.  Pap due next year  2.  Discussed breast mammography recommendation; pt would like to wait until 47. 3.  Yearly health maintenance labs ordered (fasting) and will be drawn at [facility] per patient convenience. 4.  Monthly vulvar self vulvar exams 5.  Continue clobetasol for LS

## 2019-12-03 NOTE — Progress Notes (Signed)
Last pap 11/17/17- negative

## 2020-12-08 ENCOUNTER — Other Ambulatory Visit: Payer: Self-pay

## 2020-12-08 ENCOUNTER — Other Ambulatory Visit (HOSPITAL_COMMUNITY)
Admission: RE | Admit: 2020-12-08 | Discharge: 2020-12-08 | Disposition: A | Discharge status: Home / Self Care | Payer: Commercial Managed Care - PPO | Source: Ambulatory Visit | Attending: Obstetrics & Gynecology | Admitting: Obstetrics & Gynecology

## 2020-12-08 ENCOUNTER — Encounter: Payer: Self-pay | Admitting: Obstetrics & Gynecology

## 2020-12-08 ENCOUNTER — Ambulatory Visit (INDEPENDENT_AMBULATORY_CARE_PROVIDER_SITE_OTHER): Payer: Commercial Managed Care - PPO | Admitting: Obstetrics & Gynecology

## 2020-12-08 VITALS — BP 129/69 | HR 80 | Ht 68.0 in | Wt 160.0 lb

## 2020-12-08 DIAGNOSIS — Z01419 Encounter for gynecological examination (general) (routine) without abnormal findings: Secondary | ICD-10-CM

## 2020-12-08 DIAGNOSIS — L900 Lichen sclerosus et atrophicus: Secondary | ICD-10-CM | POA: Diagnosis not present

## 2020-12-08 DIAGNOSIS — Z23 Encounter for immunization: Secondary | ICD-10-CM | POA: Diagnosis not present

## 2020-12-08 MED ORDER — CLOBETASOL PROPIONATE 0.05 % EX OINT
TOPICAL_OINTMENT | CUTANEOUS | 3 refills | Status: DC
Start: 2020-12-08 — End: 2020-12-19

## 2020-12-08 NOTE — Progress Notes (Signed)
Subjective:     [name] is a 45 y.o. female here for a routine exam.  Current complaints: none; monthly menses; no new health problems; skips clobetasol occasionally and restarts without issues.     Gynecologic History Patient's last menstrual period was 12/01/2020. Contraception: none; same sex relationship Last Pap: 2019. Results were: normal Last mammogram: never. Discussed an will get one today  Obstetric History OB History  Gravida Para Term Preterm AB Living  0 0 0 0 0 0  SAB IAB Ectopic Multiple Live Births  0 0 0 0 0     The following portions of the patient's history were reviewed and updated as appropriate: allergies, current medications, past family history, past medical history, past social history, past surgical history, and problem list.  Review of Systems Pertinent items noted in HPI and remainder of comprehensive ROS otherwise negative.    Objective:     Vitals:   12/08/20 0840  BP: 129/69  Pulse: 80  Weight: 160 lb (72.6 kg)  Height: 5\' 8"  (1.727 m)   Vitals:  WNL General appearance: alert, cooperative and no distress  HEENT: Normocephalic, without obvious abnormality, atraumatic Eyes: negative Throat: lips, mucosa, and tongue normal; teeth and gums normal  Respiratory: Clear to auscultation bilaterally  CV: Regular rate and rhythm  Breasts:  Normal appearance, no masses or tenderness, no nipple retraction or dimpling  GI: Soft, non-tender; bowel sounds normal; no masses,  no organomegaly  GU: External Genitalia:  Tanner V, no lesion; small agglutination superior labia minora that is pal pink and not progressing.  Urethra:  No prolapse   Vagina: Pink, normal rugae, no blood or discharge  Cervix: No CMT, no lesion  Uterus:  Retroverted, fiborid palpated, non tender  Adnexa: Normal, no masses, non tender  Musculoskeletal: No edema, redness or tenderness in the calves or thighs  Skin: No lesions or rash  Lymphatic: Axillary adenopathy: none      Psychiatric: Normal mood and behavior    Assessment:    Healthy female exam.  Lichen Sclerosis   Plan:    Pap with cotesting Screening mammogram Flu shot today LS stable and continue clobetasol.

## 2020-12-09 LAB — CYTOLOGY - PAP
Comment: NEGATIVE
Diagnosis: NEGATIVE
High risk HPV: NEGATIVE

## 2020-12-17 ENCOUNTER — Ambulatory Visit: Payer: Commercial Managed Care - PPO

## 2020-12-19 ENCOUNTER — Telehealth: Payer: Self-pay

## 2020-12-19 DIAGNOSIS — L900 Lichen sclerosus et atrophicus: Secondary | ICD-10-CM

## 2020-12-19 MED ORDER — CLOBETASOL PROPIONATE 0.05 % EX OINT: TOPICAL_OINTMENT | CUTANEOUS | 3 refills | Status: DC

## 2020-12-19 NOTE — Telephone Encounter (Signed)
Pt states she forgot to pick up her Temovate ointment after her appt and the pharmacy said the Rx expired. Rx resent.

## 2021-01-07 ENCOUNTER — Ambulatory Visit (INDEPENDENT_AMBULATORY_CARE_PROVIDER_SITE_OTHER): Payer: Commercial Managed Care - PPO

## 2021-01-07 ENCOUNTER — Other Ambulatory Visit: Payer: Self-pay

## 2021-01-07 DIAGNOSIS — Z1231 Encounter for screening mammogram for malignant neoplasm of breast: Secondary | ICD-10-CM | POA: Diagnosis not present

## 2021-01-07 DIAGNOSIS — Z01419 Encounter for gynecological examination (general) (routine) without abnormal findings: Secondary | ICD-10-CM

## 2021-01-09 ENCOUNTER — Other Ambulatory Visit: Payer: Self-pay | Admitting: Obstetrics & Gynecology

## 2021-01-09 ENCOUNTER — Encounter: Payer: Self-pay | Admitting: Obstetrics & Gynecology

## 2021-01-09 DIAGNOSIS — R928 Other abnormal and inconclusive findings on diagnostic imaging of breast: Secondary | ICD-10-CM

## 2021-01-12 ENCOUNTER — Encounter: Payer: Self-pay | Admitting: Obstetrics & Gynecology

## 2021-02-10 ENCOUNTER — Ambulatory Visit
Admission: RE | Admit: 2021-02-10 | Discharge: 2021-02-10 | Disposition: A | Discharge status: Home / Self Care | Payer: Commercial Managed Care - PPO | Source: Ambulatory Visit | Attending: Obstetrics & Gynecology | Admitting: Obstetrics & Gynecology

## 2021-02-10 ENCOUNTER — Ambulatory Visit: Payer: Commercial Managed Care - PPO

## 2021-02-10 DIAGNOSIS — R928 Other abnormal and inconclusive findings on diagnostic imaging of breast: Secondary | ICD-10-CM

## 2021-10-07 ENCOUNTER — Other Ambulatory Visit: Payer: Self-pay | Admitting: *Deleted

## 2021-10-07 DIAGNOSIS — L900 Lichen sclerosus et atrophicus: Secondary | ICD-10-CM

## 2021-10-07 MED ORDER — CLOBETASOL PROPIONATE 0.05 % EX OINT: TOPICAL_OINTMENT | CUTANEOUS | 3 refills | Status: DC

## 2021-10-07 NOTE — Telephone Encounter (Cosign Needed)
Pt called to schedule her annual gyn visit and get a RF on Clobetasol.  Appt made with Dr Gala Romney and RF sent to [facility] for the cream.

## 2021-12-07 ENCOUNTER — Ambulatory Visit: Payer: Commercial Managed Care - PPO | Admitting: Obstetrics & Gynecology

## 2021-12-14 ENCOUNTER — Encounter: Payer: Self-pay | Admitting: Obstetrics & Gynecology

## 2021-12-14 ENCOUNTER — Other Ambulatory Visit (HOSPITAL_COMMUNITY)
Admission: RE | Admit: 2021-12-14 | Discharge: 2021-12-14 | Disposition: A | Discharge status: Home / Self Care | Payer: Commercial Managed Care - PPO | Source: Ambulatory Visit | Attending: Obstetrics & Gynecology | Admitting: Obstetrics & Gynecology

## 2021-12-14 ENCOUNTER — Ambulatory Visit (INDEPENDENT_AMBULATORY_CARE_PROVIDER_SITE_OTHER): Payer: Commercial Managed Care - PPO | Admitting: Obstetrics & Gynecology

## 2021-12-14 VITALS — BP 125/78 | HR 79 | Ht 68.0 in | Wt 159.0 lb

## 2021-12-14 DIAGNOSIS — N841 Polyp of cervix uteri: Secondary | ICD-10-CM | POA: Insufficient documentation

## 2021-12-14 DIAGNOSIS — Z23 Encounter for immunization: Secondary | ICD-10-CM | POA: Diagnosis not present

## 2021-12-14 DIAGNOSIS — L900 Lichen sclerosus et atrophicus: Secondary | ICD-10-CM

## 2021-12-14 DIAGNOSIS — Z01419 Encounter for gynecological examination (general) (routine) without abnormal findings: Secondary | ICD-10-CM

## 2021-12-14 NOTE — Progress Notes (Signed)
Last Mammogram: 1/23 Last Pap Smear:  2022 Last Colon Screening;  never done Seat Belts:   yes Sun Screen:   yes Dental Check Up:  yes Brush & Floss:   yes

## 2021-12-14 NOTE — Addendum Note (Signed)
Addended by: Lyndal Rainbow on: 12/14/2021 09:54 AM   Modules accepted: Orders

## 2021-12-14 NOTE — Progress Notes (Signed)
Subjective:     [name] is a 46 y.o. female here for a routine exam.  Current complaints: none.  Has monthly menses.  Now a day trader and doing well.  Children are 8 and 10.    Gynecologic History No LMP recorded. Contraception: none--same sex relationship Last Mammogram: 1/23 Last Pap Smear:  2022 Last Colon Screening;  never done Seat Belts:   yes Sun Screen:   yes Dental Check Up:  yes Brush & Floss:   yes    Obstetric History OB History  Gravida Para Term Preterm AB Living  0 0 0 0 0 0  SAB IAB Ectopic Multiple Live Births  0 0 0 0 0     The following portions of the patient's history were reviewed and updated as appropriate: allergies, current medications, past family history, past medical history, past social history, past surgical history, and problem list.  Review of Systems Pertinent items noted in HPI and remainder of comprehensive ROS otherwise negative.    Objective:     Vitals:   12/14/21 0920  BP: 125/78  Pulse: 79  Weight: 159 lb (72.1 kg)  Height: '5\' 8"'$  (1.727 m)   Vitals:  WNL General appearance: alert, cooperative and no distress  HEENT: Normocephalic, without obvious abnormality, atraumatic Eyes: negative Throat: lips, mucosa, and tongue normal; teeth and gums normal  Respiratory: Clear to auscultation bilaterally  CV: Regular rate and rhythm  Breasts:  Normal appearance, no masses or tenderness, no nipple retraction or dimpling  GI: Soft, non-tender; bowel sounds normal; no masses,  no organomegaly  GU: External Genitalia:  Tanner V, some agglutination of superior labia minora--reviewed placement of clobetasol with mirror Urethra:  No prolapse   Vagina: Pink, normal rugae, no blood or discharge  Cervix: No CMT, no lesion  Uterus:  Normal size and contour, non tender  Adnexa: Normal, no masses, non tender  Musculoskeletal: No edema, redness or tenderness in the calves or thighs  Skin: No lesions or rash  Lymphatic: Axillary  adenopathy: none     Psychiatric: Normal mood and behavior        Assessment:    Healthy female exam.  LS   Plan:    1.  Pap smear up to date 2.  Yearly Mammograms 3.  Pt prefers cologard next year.  4.  Endocervical polyp removed 5.  LS--reviewed correct administration of clobetasol of introitus and labia minora.  6.  Flu shot  ENDOCERVICAL POLYP REMOVAL  After informed consent was obtained, the pt was placed in dorsal lithotomy position.  A bivalved speculum was placed into the vagina.  The cervix was brought into view.  The cervix was cleaned with betadine x2.  A polyp forcep was used to grasp the endocervical polyp and the polyp was removed by twisting the base.  The polyp was sent to pathology.  There was scant bleeding at the end of the procedure and silver nitrate was used to aid in hemostasis.   We will send a my chart message with the pathology results next week.  If the patient continues to bleed or spot longer than one week, she will need to return for further evaluation.

## 2021-12-15 LAB — SURGICAL PATHOLOGY

## 2022-03-01 ENCOUNTER — Other Ambulatory Visit: Payer: Self-pay | Admitting: Obstetrics & Gynecology

## 2022-03-01 DIAGNOSIS — Z1231 Encounter for screening mammogram for malignant neoplasm of breast: Secondary | ICD-10-CM

## 2022-04-21 ENCOUNTER — Ambulatory Visit
Admission: RE | Admit: 2022-04-21 | Discharge: 2022-04-21 | Disposition: A | Discharge status: Home / Self Care | Payer: Commercial Managed Care - PPO | Source: Ambulatory Visit | Attending: Obstetrics & Gynecology | Admitting: Obstetrics & Gynecology

## 2022-04-21 DIAGNOSIS — Z1231 Encounter for screening mammogram for malignant neoplasm of breast: Secondary | ICD-10-CM

## 2022-05-10 IMAGING — MG MM DIGITAL DIAGNOSTIC UNILAT*L* W/ TOMO W/ CAD
4 series · 4 of 12 positions shown · non-contrast
Comparison: Previous exam(s).

CLINICAL DATA: 45-year-old female recalled from baseline screening
mammogram dated 01/07/2021 for a possible left breast asymmetry.

EXAM:
DIGITAL DIAGNOSTIC UNILATERAL LEFT MAMMOGRAM WITH TOMOSYNTHESIS AND
CAD
TECHNIQUE: Left digital diagnostic mammography and breast tomosynthesis was
performed. The images were evaluated with computer-aided detection.

[L CC synth-2D]
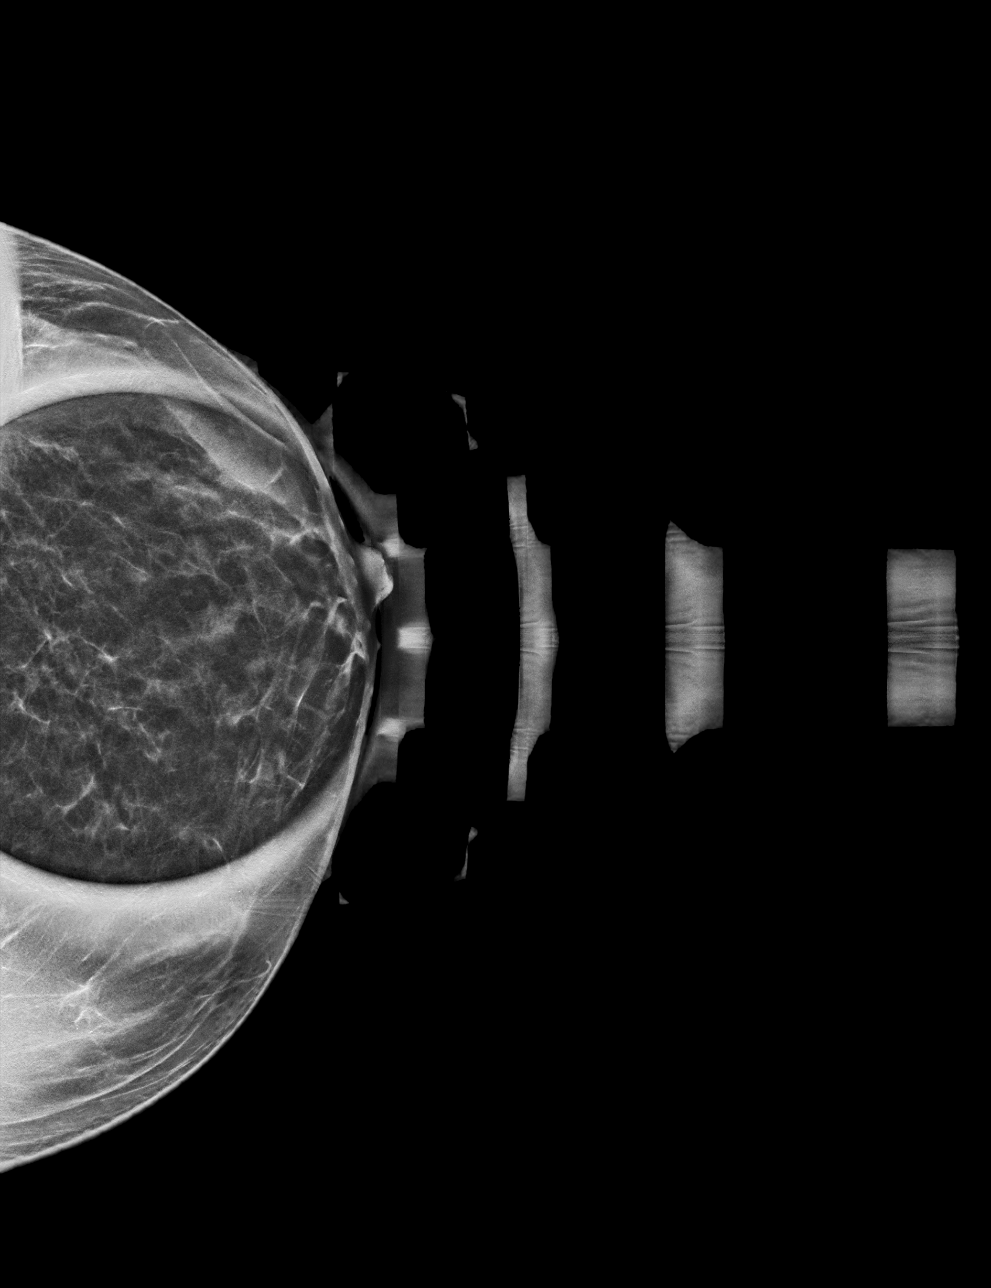

[L ML synth-2D]
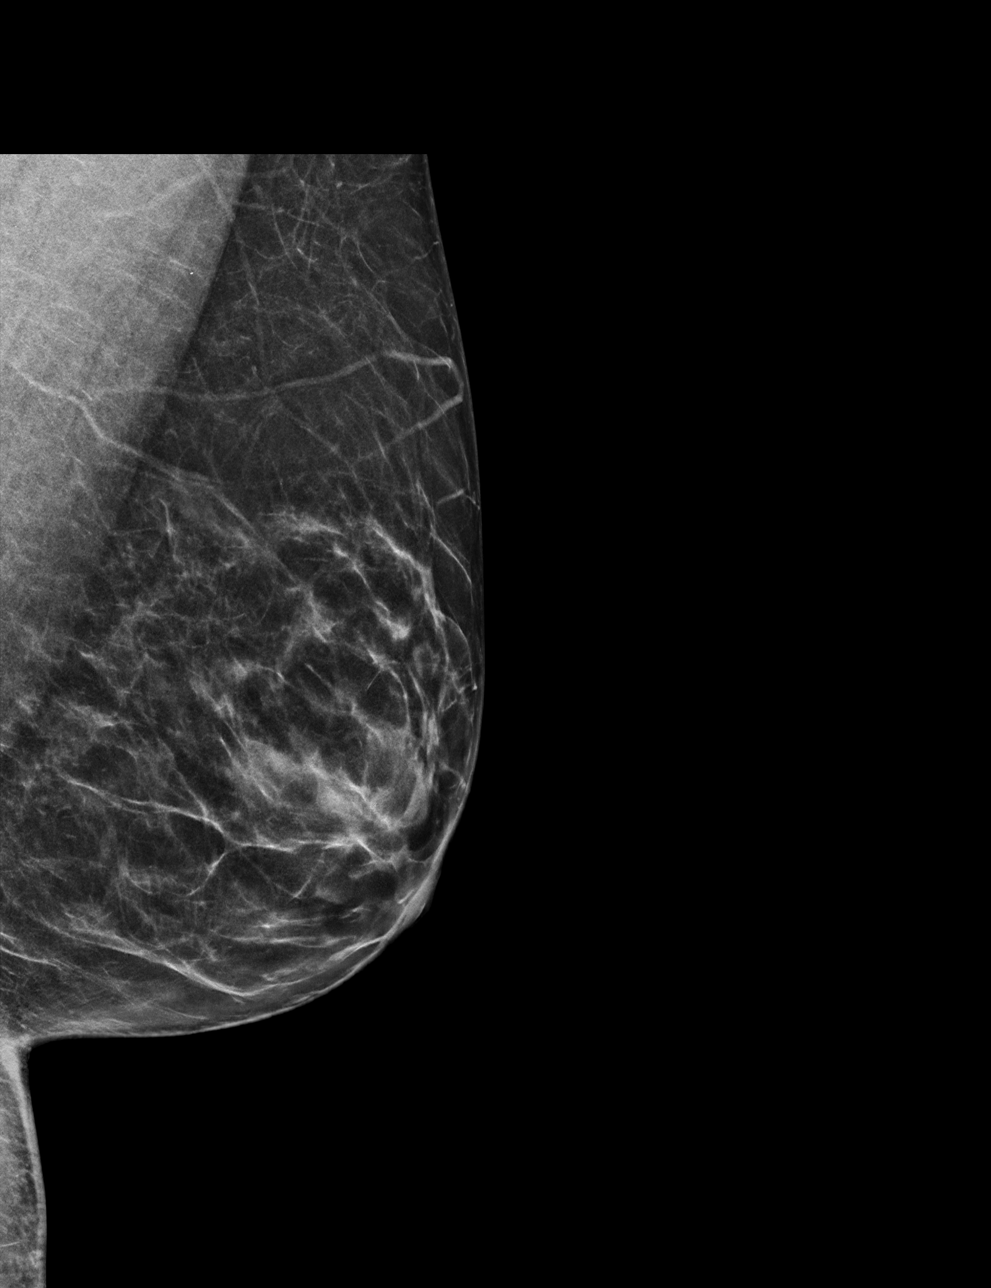

[L ML tomo · tomo slice 29/56.0]
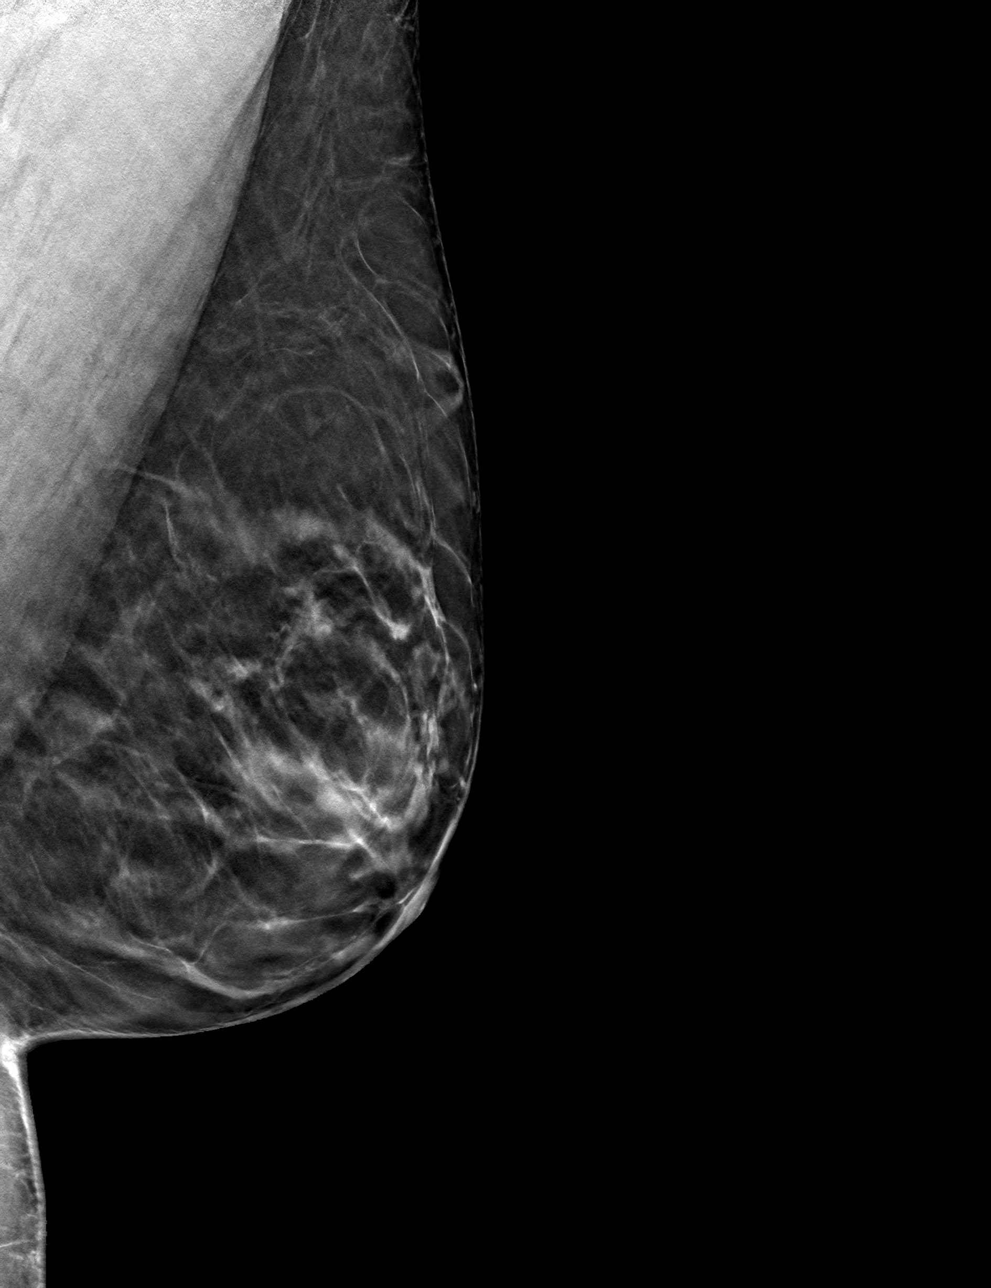

[L CC tomo · tomo slice 23/45.0]
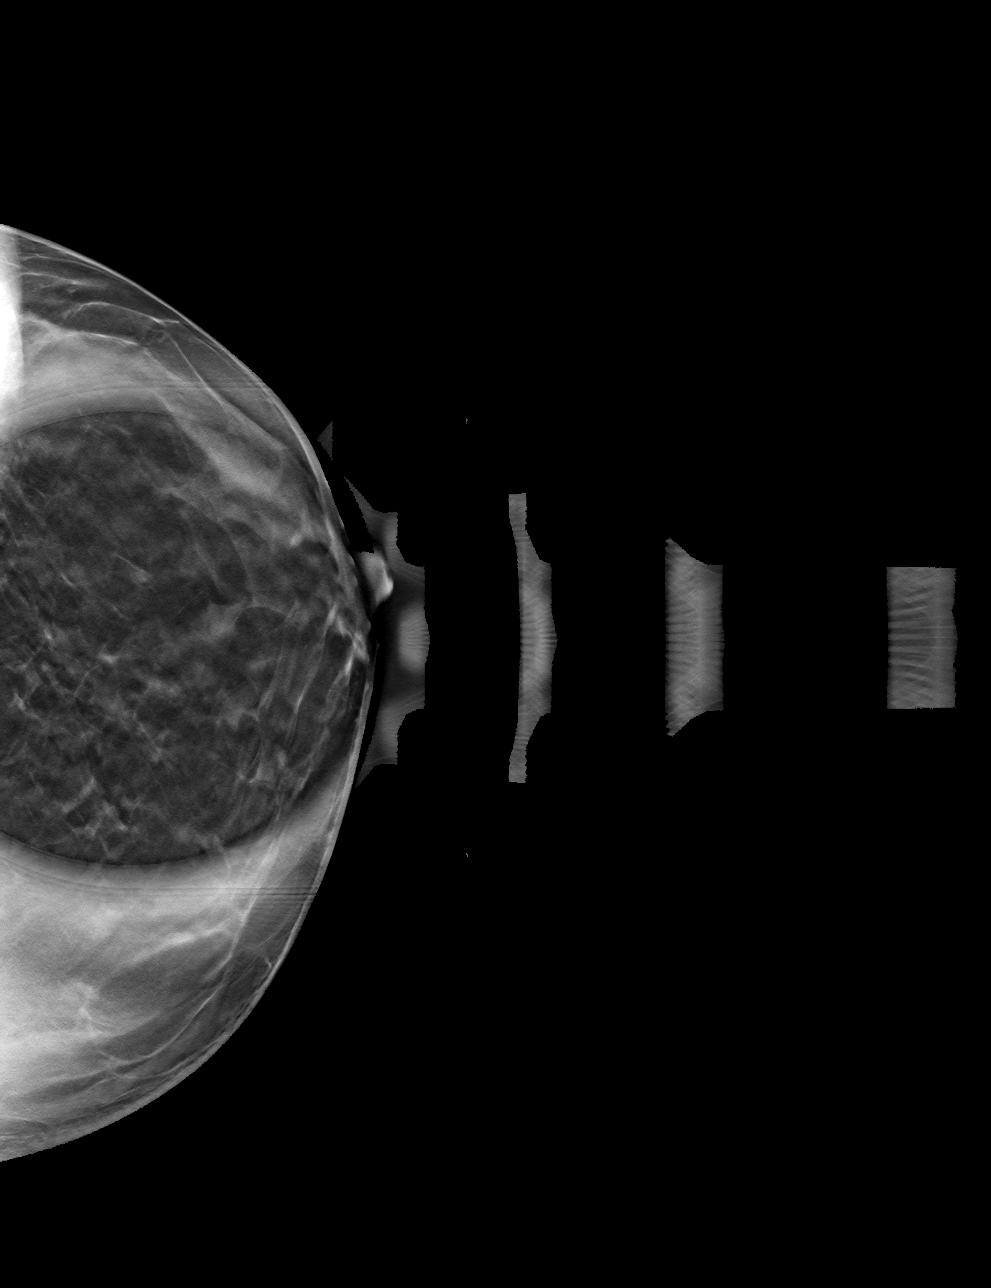

[4 of 12 positions shown; findings below may reference images not displayed]

ACR Breast Density Category b: There are scattered areas of
fibroglandular density.
FINDINGS: Possible asymmetry in the subareolar left breast resolves into well
dispersed fibroglandular tissue on today's additional views. No
suspicious findings are identified.
IMPRESSION: No mammographic evidence of malignancy.

RECOMMENDATION:
Screening mammogram in one year.(Code:[id number])

I have discussed the findings and recommendations with the patient.
If applicable, a reminder letter will be sent to the patient
regarding the next appointment.

BI-RADS CATEGORY  1: Negative.

## 2023-01-03 ENCOUNTER — Encounter: Payer: Self-pay | Admitting: Obstetrics & Gynecology

## 2023-01-03 ENCOUNTER — Ambulatory Visit (INDEPENDENT_AMBULATORY_CARE_PROVIDER_SITE_OTHER): Payer: Commercial Managed Care - PPO | Admitting: Obstetrics & Gynecology

## 2023-01-03 ENCOUNTER — Other Ambulatory Visit (HOSPITAL_COMMUNITY)
Admission: RE | Admit: 2023-01-03 | Discharge: 2023-01-03 | Disposition: A | Discharge status: Home / Self Care | Payer: Commercial Managed Care - PPO | Source: Ambulatory Visit | Attending: Obstetrics & Gynecology | Admitting: Obstetrics & Gynecology

## 2023-01-03 VITALS — BP 138/77 | HR 75 | Ht 68.0 in | Wt 156.0 lb

## 2023-01-03 DIAGNOSIS — L900 Lichen sclerosus et atrophicus: Secondary | ICD-10-CM

## 2023-01-03 DIAGNOSIS — K649 Unspecified hemorrhoids: Secondary | ICD-10-CM

## 2023-01-03 DIAGNOSIS — Z01411 Encounter for gynecological examination (general) (routine) with abnormal findings: Secondary | ICD-10-CM

## 2023-01-03 DIAGNOSIS — Z0000 Encounter for general adult medical examination without abnormal findings: Secondary | ICD-10-CM

## 2023-01-03 DIAGNOSIS — N841 Polyp of cervix uteri: Secondary | ICD-10-CM | POA: Diagnosis present

## 2023-01-03 MED ORDER — CLOBETASOL PROPIONATE 0.05 % EX OINT: TOPICAL_OINTMENT | CUTANEOUS | 3 refills | Status: DC

## 2023-01-03 NOTE — Progress Notes (Signed)
Subjective:     Corea Neyra is a 47 y.o. female here for a routine exam.  Current complaints: hemorrhoid.  She would like to go to a general surgeon to have it removed.  This referral was done today.  Menstrual cycles once a month and are light.  She reports no other problems.   Gynecologic History Patient's last menstrual period was 12/17/2022. Contraception: none Last Mammogram: 04/21/22- negative Last Pap Smear:  12/08/20- negative Last Colon Screening;  hasn't scheduled yet Seat Belts:   yes Sun Screen:   yes Dental Check Up:  yes Brush & Floss:  yes   Obstetric History OB History  Gravida Para Term Preterm AB Living  0 0 0 0 0 0  SAB IAB Ectopic Multiple Live Births  0 0 0 0 0     The following portions of the patient's history were reviewed and updated as appropriate: allergies, current medications, past family history, past medical history, past social history, past surgical history, and problem list.  Review of Systems Pertinent items noted in HPI and remainder of comprehensive ROS otherwise negative.    Objective:     Vitals:   01/03/23 0826  BP: (!) 147/77  Pulse: 75  Weight: 156 lb (70.8 kg)  Height: 5\' 8"  (1.727 m)   Vitals:  WNL General appearance: alert, cooperative and no distress  HEENT: Normocephalic, without obvious abnormality, atraumatic Eyes: negative Throat: lips, mucosa, and tongue normal; teeth and gums normal  Respiratory: Clear to auscultation bilaterally  CV: Regular rate and rhythm  Breasts:  Normal appearance, no masses or tenderness, no nipple retraction or dimpling  GI: Soft, non-tender; bowel sounds normal; no masses,  no organomegaly  GU: External Genitalia:  Tanner V, lichen sclerosus seems to be well-controlled.  Right labia minora still smaller than the left labia minora.  There is a fusion developing centrally.  Patient will concentrate clobetasol ointment there. Urethra:  No prolapse   Vagina: Pink, normal rugae, no blood or  discharge  Cervix: No CMT, no lesion  Uterus:  Normal size and contour, non tender  Adnexa: Normal, no masses, non tender  Musculoskeletal: No edema, redness or tenderness in the calves or thighs  Skin: No lesions or rash  Lymphatic: Axillary adenopathy: none     Psychiatric: Normal mood and behavior        Assessment:    Healthy female exam.    Plan:    BP elevated--120/60 at home and in class.  Gets elevated at office.  Pap not due Cologuard (declines colonoscopy). Mammograms yearly.  Flu shot 2 weeks ago Small, subcentimeter endovocal polyp noted again.  It was very difficult to grasp and removed given its small size. HM labs fasting ordered.  LS--stable; continue clobetasol twice a week.    Removal of endocervical polyp  Procedure explained in detail and informed consent obtained.  Risks include bleeding and infection.  Small sub centimeter polyp grasped with ring forceps and twisted off.  Specimen handed off to RN.  Hemostasis controlled with silver nitrate.  The polyp was very small and difficult to grasp.  Will continue to monitor.

## 2023-01-03 NOTE — Progress Notes (Signed)
Last Mammogram: 04/21/22- negative Last Pap Smear:  12/08/20- negative Last Colon Screening;  hasn't scheduled yet Seat Belts:   yes Sun Screen:   yes Dental Check Up:  yes Brush & Floss:  yes

## 2023-01-04 LAB — SURGICAL PATHOLOGY

## 2023-01-07 ENCOUNTER — Other Ambulatory Visit: Payer: Self-pay | Admitting: Medical Genetics

## 2023-01-25 LAB — LIPID PANEL
Chol/HDL Ratio: 4 {ratio} (ref 0.0–4.4)
Cholesterol, Total: 266 mg/dL — ABNORMAL HIGH (ref 100–199)
HDL: 67 mg/dL (ref >39)
LDL Chol Calc (NIH): 183 mg/dL — ABNORMAL HIGH (ref 0–99)
Triglycerides: 94 mg/dL (ref 0–149)
VLDL Cholesterol Cal: 16 mg/dL (ref 5–40)

## 2023-01-25 LAB — COMPREHENSIVE METABOLIC PANEL
ALT: 11 [IU]/L (ref 0–32)
AST: 26 [IU]/L (ref 0–40)
Albumin: 4.6 g/dL (ref 3.9–4.9)
Alkaline Phosphatase: 53 [IU]/L (ref 44–121)
BUN/Creatinine Ratio: 17 (ref 9–23)
BUN: 15 mg/dL (ref 6–24)
Bilirubin Total: 0.3 mg/dL (ref 0.0–1.2)
CO2: 21 mmol/L (ref 20–29)
Calcium: 9.8 mg/dL (ref 8.7–10.2)
Chloride: 104 mmol/L (ref 96–106)
Creatinine, Ser: 0.89 mg/dL (ref 0.57–1.00)
Globulin, Total: 2.5 g/dL (ref 1.5–4.5)
Glucose: 87 mg/dL (ref 70–99)
Potassium: 4.4 mmol/L (ref 3.5–5.2)
Sodium: 140 mmol/L (ref 134–144)
Total Protein: 7.1 g/dL (ref 6.0–8.5)
eGFR: 81 mL/min/{1.73_m2} (ref >59)

## 2023-01-25 LAB — CBC
Hematocrit: 40.9 % (ref 34.0–46.6)
Hemoglobin: 12.5 g/dL (ref 11.1–15.9)
MCH: 27.3 pg (ref 26.6–33.0)
MCHC: 30.6 g/dL — ABNORMAL LOW (ref 31.5–35.7)
MCV: 89 fL (ref 79–97)
Platelets: 316 10*3/uL (ref 150–450)
RBC: 4.58 x10E6/uL (ref 3.77–5.28)
RDW: 13.6 % (ref 11.7–15.4)
WBC: 7.7 10*3/uL (ref 3.4–10.8)

## 2023-01-25 LAB — TSH: TSH: 2.22 u[IU]/mL (ref 0.450–4.500)

## 2023-02-10 ENCOUNTER — Encounter: Payer: Self-pay | Admitting: Obstetrics & Gynecology

## 2023-02-10 DIAGNOSIS — E7800 Pure hypercholesterolemia, unspecified: Secondary | ICD-10-CM | POA: Insufficient documentation

## 2023-04-04 ENCOUNTER — Encounter: Payer: Self-pay | Admitting: Obstetrics & Gynecology

## 2023-08-02 ENCOUNTER — Other Ambulatory Visit: Payer: Self-pay | Admitting: Obstetrics & Gynecology

## 2023-08-02 ENCOUNTER — Other Ambulatory Visit: Payer: Self-pay

## 2023-08-02 DIAGNOSIS — L900 Lichen sclerosus et atrophicus: Secondary | ICD-10-CM

## 2023-08-02 DIAGNOSIS — Z1231 Encounter for screening mammogram for malignant neoplasm of breast: Secondary | ICD-10-CM

## 2023-08-02 MED ORDER — CLOBETASOL PROPIONATE 0.05 % EX OINT: TOPICAL_OINTMENT | CUTANEOUS | 3 refills | Status: DC

## 2023-08-04 ENCOUNTER — Ambulatory Visit
Admission: RE | Admit: 2023-08-04 | Discharge: 2023-08-04 | Disposition: A | Discharge status: Home / Self Care | Source: Ambulatory Visit | Attending: Obstetrics & Gynecology | Admitting: Obstetrics & Gynecology

## 2023-08-04 DIAGNOSIS — Z1231 Encounter for screening mammogram for malignant neoplasm of breast: Secondary | ICD-10-CM

## 2023-08-17 ENCOUNTER — Ambulatory Visit: Payer: Self-pay | Admitting: Obstetrics & Gynecology

## 2023-12-02 ENCOUNTER — Other Ambulatory Visit: Payer: Self-pay | Admitting: Medical Genetics

## 2023-12-02 DIAGNOSIS — Z006 Encounter for examination for normal comparison and control in clinical research program: Secondary | ICD-10-CM

## 2024-01-09 ENCOUNTER — Other Ambulatory Visit (HOSPITAL_COMMUNITY)
Admission: RE | Admit: 2024-01-09 | Discharge: 2024-01-09 | Disposition: A | Discharge status: Home / Self Care | Source: Ambulatory Visit | Attending: Obstetrics & Gynecology | Admitting: Obstetrics & Gynecology

## 2024-01-09 ENCOUNTER — Encounter: Payer: Self-pay | Admitting: Obstetrics & Gynecology

## 2024-01-09 ENCOUNTER — Ambulatory Visit: Admitting: Obstetrics & Gynecology

## 2024-01-09 ENCOUNTER — Other Ambulatory Visit (HOSPITAL_COMMUNITY)
Admission: RE | Admit: 2024-01-09 | Discharge: 2024-01-09 | Disposition: A | Source: Ambulatory Visit | Attending: Obstetrics & Gynecology | Admitting: Obstetrics & Gynecology

## 2024-01-09 VITALS — BP 120/78 | HR 91 | Ht 67.0 in | Wt 138.0 lb

## 2024-01-09 DIAGNOSIS — Z1329 Encounter for screening for other suspected endocrine disorder: Secondary | ICD-10-CM

## 2024-01-09 DIAGNOSIS — L900 Lichen sclerosus et atrophicus: Secondary | ICD-10-CM

## 2024-01-09 DIAGNOSIS — N920 Excessive and frequent menstruation with regular cycle: Secondary | ICD-10-CM

## 2024-01-09 DIAGNOSIS — Z01419 Encounter for gynecological examination (general) (routine) without abnormal findings: Secondary | ICD-10-CM

## 2024-01-09 DIAGNOSIS — N841 Polyp of cervix uteri: Secondary | ICD-10-CM

## 2024-01-09 DIAGNOSIS — Z13220 Encounter for screening for lipoid disorders: Secondary | ICD-10-CM

## 2024-01-09 MED ORDER — CLOBETASOL PROPIONATE 0.05 % EX OINT: TOPICAL_OINTMENT | CUTANEOUS | 3 refills | Status: AC

## 2024-01-09 NOTE — Progress Notes (Signed)
  Subjective:     [name] is a 48 y.o. female here for a routine exam.  Current complaints: last 2 menses were fine; the two before that had some clots. Pt has hx of 4.6 cm fibroid.  Is interested in US  if heavy bleeding returns.  Uses clobetasol  for LS.  Lost weight nad interested in cholesterol levels now--would liek labs redrawn.  Hx of endocervical polyp removed x2.     Gynecologic History Patient's last menstrual period was 12/23/2023. Contraception: none Last pap smear (date and result):12/08/20- Negative Last mammogram (date and result):08/31/23 Last colon screening (date and result):Cologuard - pt reports negative Brush:yes Floss:yes Seatbelts: yes Sunscreen: no  Obstetric History OB History  Gravida Para Term Preterm AB Living  0 0 0 0 0 0  SAB IAB Ectopic Multiple Live Births  0 0 0 0 0     The following portions of the patient's history were reviewed and updated as appropriate: allergies, current medications, past family history, past medical history, past social history, past surgical history, and problem list.  Review of Systems Pertinent items noted in HPI and remainder of comprehensive ROS otherwise negative.    Objective:     Vitals:   01/09/24 1537  BP: 120/78  Pulse: 91  Weight: 138 lb (62.6 kg)  Height: 5' 7 (1.702 m)   Vitals:  WNL General appearance: alert, cooperative and no distress  HEENT: Normocephalic, without obvious abnormality, atraumatic Eyes: negative Throat: lips, mucosa, and tongue normal; teeth and gums normal  Respiratory: Clear to auscultation bilaterally  CV: Regular rate and rhythm  Breasts:  Normal appearance, no masses or tenderness, no nipple retraction or dimpling  GI: Soft, non-tender; bowel sounds normal; no masses,  no organomegaly  GU: External Genitalia:  Tanner V, LS stable--still fusion of labia minora between introitus and clitoris Urethra:  No prolapse   Vagina: Pink, normal rugae, no blood or discharge   Cervix: No CMT, sub centimeter endocervical polyp present  Uterus:  Enlarged, retroverted, non mobile   Adnexa: Normal, no masses, non tender  Musculoskeletal: No edema, redness or tenderness in the calves or thighs  Skin: No lesions or rash  Lymphatic: Axillary adenopathy: none     Psychiatric: Normal mood and behavior        Assessment:    Healthy female exam.    Plan:    Pap with co testing Endocervical polyp removed Pelvic US  complete with TVUS to eval for uterine size/fibroid and lining thickness.  Lipid, cbc, cmp, tsh Refill clobetasol ; reviewed where to place med to prevent further fusion  Removal of endocervical polyp  Procedure explained in detail and informed consent obtained.  Risks include bleeding and infection.  Small sub centimeter polyp grasped with ring forceps and twisted off.  Specimen handed off to RN.  Hemostasis controlled with silver nitrate.

## 2024-01-11 ENCOUNTER — Ambulatory Visit: Payer: Self-pay | Admitting: Obstetrics & Gynecology

## 2024-01-11 LAB — SURGICAL PATHOLOGY

## 2024-01-11 LAB — CYTOLOGY - PAP
Comment: NEGATIVE
Diagnosis: NEGATIVE
Diagnosis: REACTIVE
High risk HPV: NEGATIVE

## 2024-01-13 ENCOUNTER — Other Ambulatory Visit

## 2024-02-08 ENCOUNTER — Other Ambulatory Visit
